# Patient Record
Sex: Female | Born: 1975
Health system: Southern US, Community
[De-identification: ages and names within clinical notes are randomized; demographics above are authoritative.]

## PROBLEM LIST (undated history)

## (undated) DIAGNOSIS — E119 Type 2 diabetes mellitus without complications: Secondary | ICD-10-CM

## (undated) DIAGNOSIS — E669 Obesity, unspecified: Secondary | ICD-10-CM

## (undated) DIAGNOSIS — N939 Abnormal uterine and vaginal bleeding, unspecified: Secondary | ICD-10-CM

## (undated) DIAGNOSIS — J45909 Unspecified asthma, uncomplicated: Secondary | ICD-10-CM

## (undated) DIAGNOSIS — K219 Gastro-esophageal reflux disease without esophagitis: Secondary | ICD-10-CM

## (undated) DIAGNOSIS — D219 Benign neoplasm of connective and other soft tissue, unspecified: Secondary | ICD-10-CM

## (undated) DIAGNOSIS — D649 Anemia, unspecified: Secondary | ICD-10-CM

## (undated) DIAGNOSIS — R569 Unspecified convulsions: Secondary | ICD-10-CM

## (undated) HISTORY — DX: Benign neoplasm of connective and other soft tissue, unspecified: D21.9

## (undated) HISTORY — PX: HYSTEROSCOPY WITH SALPINGOGRAM: SHX6653

## (undated) HISTORY — PX: BREAST REDUCTION SURGERY: SHX8

## (undated) HISTORY — PX: PANNICULECTOMY: SHX5360

## (undated) HISTORY — PX: KIDNEY STONE SURGERY: SHX686

## (undated) HISTORY — PX: LAPAROSCOPIC GASTRIC SLEEVE RESECTION: SHX5895

## (undated) HISTORY — DX: Type 2 diabetes mellitus without complications: E11.9

## (undated) HISTORY — PX: GASTRIC BYPASS: SHX52

## (undated) HISTORY — DX: Abnormal uterine and vaginal bleeding, unspecified: N93.9

## (undated) HISTORY — DX: Obesity, unspecified: E66.9

## (undated) HISTORY — DX: Anemia, unspecified: D64.9

## (undated) HISTORY — DX: Unspecified convulsions: R56.9

---

## 2013-05-02 DIAGNOSIS — G4730 Sleep apnea, unspecified: Secondary | ICD-10-CM | POA: Insufficient documentation

## 2019-07-03 ENCOUNTER — Encounter: Payer: Self-pay | Admitting: Family Medicine

## 2019-07-03 DIAGNOSIS — E119 Type 2 diabetes mellitus without complications: Secondary | ICD-10-CM | POA: Insufficient documentation

## 2019-07-05 ENCOUNTER — Encounter: Payer: Self-pay | Admitting: Family Medicine

## 2019-07-05 ENCOUNTER — Ambulatory Visit (INDEPENDENT_AMBULATORY_CARE_PROVIDER_SITE_OTHER): Payer: BC Managed Care – PPO | Admitting: Family Medicine

## 2019-07-05 ENCOUNTER — Other Ambulatory Visit: Payer: Self-pay

## 2019-07-05 VITALS — BP 138/82 | HR 73 | Ht 64.0 in | Wt 244.2 lb

## 2019-07-05 DIAGNOSIS — N939 Abnormal uterine and vaginal bleeding, unspecified: Secondary | ICD-10-CM

## 2019-07-05 DIAGNOSIS — D500 Iron deficiency anemia secondary to blood loss (chronic): Secondary | ICD-10-CM | POA: Diagnosis not present

## 2019-07-05 DIAGNOSIS — E119 Type 2 diabetes mellitus without complications: Secondary | ICD-10-CM | POA: Diagnosis not present

## 2019-07-05 LAB — POCT GLYCOSYLATED HEMOGLOBIN (HGB A1C): HbA1c, POC (controlled diabetic range): 7.5 % — AB (ref 0.0–7.0)

## 2019-07-05 MED ORDER — MEDROXYPROGESTERONE ACETATE 10 MG PO TABS: 10.0000 mg | ORAL_TABLET | Freq: Every day | ORAL | 0 refills | Status: DC

## 2019-07-05 NOTE — Progress Notes (Signed)
SUBJECTIVE:   CHIEF COMPLAINT / HPI:  Ms. Calcote is a 44 yo female who presents to establish care. She moved to [place] 2 years ago from [place] to be closer to family. She works 10 hour days for [employer]. She is single, no children. Is not sexually active and has never use tobacco products. She does not endorse alcohol use. Her concerns today are her diabetes and bleeding for 3 months.  Diabetes She is worried that her diabetes is out of control. She has not been taking any medication for it and has not seen a doctor in 2 years. She has had gastric sleeve surgery with partial antrum removal in 2015 where she lost weight but has since gained it back with poor eating habits. She is hungry all the time even after she eats. She recently at ravioli out of the can adding rice to it. She voices understanding that her diet needs to change but her job hinders her in a way of just needing quick meals. She does not check her blood sugars regular but has never needed to be on insulin in the past. She is not experiencing increasing numbness in her feet and hands and is worried about a black spot on her toe.   Abnormal Uterine Bleeding Experiencing on going uterine bleeding for 3 months. Was last seen for lower abdominal pain in 2019 where large fibroids were found and hysterectomy was recommended. Patient wanted the chance to have children and a silicone embolization was performed. Began to have regular period after that procedure but is now having on going bleeding that requires 3-12 pads/day. She does endorse fatigue and has passed clots described as big as an orange is round but flat. Color ranges from bright red to dark brown and is sometimes stringy.   PERTINENT  PMH / PSH: Fibroids (embolization procedure), sleeve gastrectomy, panis removal (does not have a navel), breast reduction, OSA.   OBJECTIVE:   BP 138/82   Pulse 73   Ht 5\' 4"  (1.626 m)   Wt 244 lb 3.2 oz (110.8 kg)    SpO2 100%   BMI 41.92 kg/m    General: Appears well, no acute distress. Age appropriate. Cardiac: RRR, normal heart sounds, no murmurs Respiratory: CTAB, normal effort Abdomen: soft, nontender, non distended, +BS Extremities: No edema or cyanosis. Skin: Warm and dry, no rashes notes  Diabetic Foot Exam - Simple   Simple Foot Form Diabetic Foot exam was performed with the following findings: Yes 07/05/2019  3:45 PM  Visual Inspection No deformities, no ulcerations, no other skin breakdown bilaterally: Yes Sensation Testing Intact to touch and monofilament testing bilaterally: Yes Pulse Check Posterior Tibialis and Dorsalis pulse intact bilaterally: Yes Comments      ASSESSMENT/PLAN:   Type II diabetes mellitus (HCC) Insulin naive. HgB A1c 7.5. Endorses polyphagia and parathesias. Patient previously taking metformin 500mg  BID. Spot on toe likely due from stubbing the toe and potentially not realizing it. Will continue to monitor for any changes. -Start Metformin BID -Consider gabapentin -Follow up in 1 week  Abnormal uterine bleeding Ongoing for a duration of 3 months with fatigue and known history of leiomyomas with previous suggestions of hysterectomy. Going through 3-12 pads a day. Hgb 8.2, MCV 60, platelets 463. Lymphocytes 3.2. CMP wnl. Anemia panel TIBC 404 Iron 11 Iron sat 3, Ferritin 6, retic ct 1.1. Etiology likely due to fibroids although other etiologies such as cancer or bleeding disorders cannot be ruled out. Less likely due  to hypothyroidism due to normal TSH of 1.660. Other endocrine cause such as diabetes can play a role in AUB but not very poorly controlled with an A1c of 7.5. -Provera 10mg  for 10 days -Referral to GYN -Follow up in 1 week  Iron deficiency anemia due to chronic blood loss Hgb 8.2, MCV 60, platelets 463. Lymphocytes 3.2. CMP wnl. Anemia panel TIBC 404 Iron 11 Iron sat 3, Ferritin 6, retic ct 1.1.  -Ferrous sulfate 300mg  daily -Gyn  referral -Follow up in 1 week   [place]

## 2019-07-05 NOTE — Patient Instructions (Addendum)
It was very nice to meet you today. Please enjoy the rest of your week. Will call you with your lab results.   You should get a call from the gynecologist office to make an appointment.   Take provera for 10 days. If you have any concerns please call us.  Please call the clinic at [phone] if your symptoms worsen or you have any concerns. It was our pleasure to serve you.

## 2019-07-06 ENCOUNTER — Telehealth: Payer: Self-pay | Admitting: Family Medicine

## 2019-07-06 DIAGNOSIS — N939 Abnormal uterine and vaginal bleeding, unspecified: Secondary | ICD-10-CM | POA: Insufficient documentation

## 2019-07-06 DIAGNOSIS — D500 Iron deficiency anemia secondary to blood loss (chronic): Secondary | ICD-10-CM | POA: Insufficient documentation

## 2019-07-06 MED ORDER — FERROUS SULFATE 300 (60 FE) MG/5ML PO SYRP: 300.0000 mg | ORAL_SOLUTION | Freq: Every day | ORAL | 3 refills | Status: DC

## 2019-07-06 MED ORDER — METFORMIN HCL ER 500 MG PO TB24: 500.0000 mg | ORAL_TABLET | Freq: Every day | ORAL | 3 refills | Status: DC

## 2019-07-06 NOTE — Assessment & Plan Note (Addendum)
Insulin naive. HgB A1c 7.5. Endorses polyphagia and parathesias. Patient previously taking metformin 500mg  BID. Spot on toe likely due from stubbing the toe and potentially not realizing it. Will continue to monitor for any changes. -Start Metformin BID -Consider gabapentin -Follow up in 1 week

## 2019-07-06 NOTE — Assessment & Plan Note (Signed)
Hgb 8.2, MCV 60, platelets 463. Lymphocytes 3.2. CMP wnl. Anemia panel TIBC 404 Iron 11 Iron sat 3, Ferritin 6, retic ct 1.1.  -Ferrous sulfate 300mg  daily -Gyn referral -Follow up in 1 week

## 2019-07-06 NOTE — Assessment & Plan Note (Addendum)
Ongoing for a duration of 3 months with fatigue and known history of leiomyomas with previous suggestions of hysterectomy. Going through 3-12 pads a day. Hgb 8.2, MCV 60, platelets 463. Lymphocytes 3.2. CMP wnl. Anemia panel TIBC 404 Iron 11 Iron sat 3, Ferritin 6, retic ct 1.1. Etiology likely due to fibroids although other etiologies such as cancer or bleeding disorders cannot be ruled out. Less likely due to hypothyroidism due to normal TSH of 1.660. Other endocrine cause such as diabetes can play a role in AUB but not very poorly controlled with an A1c of 7.5. -Provera 10mg  for 10 days -Referral to GYN -Follow up in 1 week

## 2019-07-06 NOTE — Telephone Encounter (Signed)
Attempted to call patient with results. Phone goes to voicemail. Will continue to try. Sent metformin and ferrous sulfate to pharmacy.

## 2019-07-07 LAB — CBC WITH DIFFERENTIAL/PLATELET
Basophils Absolute: 0 10*3/uL (ref 0.0–0.2)
Basos: 0 %
EOS (ABSOLUTE): 0.3 10*3/uL (ref 0.0–0.4)
Eos: 4 %
Hemoglobin: 8.2 g/dL — ABNORMAL LOW (ref 11.1–15.9)
Immature Grans (Abs): 0 10*3/uL (ref 0.0–0.1)
Immature Granulocytes: 0 %
Lymphocytes Absolute: 3.2 10*3/uL — ABNORMAL HIGH (ref 0.7–3.1)
Lymphs: 34 %
MCH: 16 pg — ABNORMAL LOW (ref 26.6–33.0)
MCHC: 26.5 g/dL — ABNORMAL LOW (ref 31.5–35.7)
MCV: 60 fL — ABNORMAL LOW (ref 79–97)
Monocytes Absolute: 0.7 10*3/uL (ref 0.1–0.9)
Monocytes: 7 %
Neutrophils Absolute: 5.1 10*3/uL (ref 1.4–7.0)
Neutrophils: 55 %
Platelets: 463 10*3/uL — ABNORMAL HIGH (ref 150–450)
RBC: 5.13 x10E6/uL (ref 3.77–5.28)
RDW: 21 % — ABNORMAL HIGH (ref 11.7–15.4)
WBC: 9.4 10*3/uL (ref 3.4–10.8)

## 2019-07-07 LAB — ANEMIA PANEL
Ferritin: 6 ng/mL — ABNORMAL LOW (ref 15–150)
Folate, Hemolysate: 278 ng/mL
Folate, RBC: 900 ng/mL (ref >498)
Hematocrit: 30.9 % — ABNORMAL LOW (ref 34.0–46.6)
Iron Saturation: 3 % — CL (ref 15–55)
Iron: 11 ug/dL — ABNORMAL LOW (ref 27–159)
Retic Ct Pct: 1.1 % (ref 0.6–2.6)
Total Iron Binding Capacity: 404 ug/dL (ref 250–450)
UIBC: 393 ug/dL (ref 131–425)
Vitamin B-12: 353 pg/mL (ref 232–1245)

## 2019-07-07 LAB — COMPREHENSIVE METABOLIC PANEL
ALT: 17 IU/L (ref 0–32)
AST: 20 IU/L (ref 0–40)
Albumin/Globulin Ratio: 0.9 — ABNORMAL LOW (ref 1.2–2.2)
Albumin: 3.8 g/dL (ref 3.8–4.8)
Alkaline Phosphatase: 107 IU/L (ref 39–117)
BUN/Creatinine Ratio: 16 (ref 9–23)
BUN: 10 mg/dL (ref 6–24)
Bilirubin Total: <0.2 mg/dL (ref 0.0–1.2)
CO2: 22 mmol/L (ref 20–29)
Calcium: 9.2 mg/dL (ref 8.7–10.2)
Chloride: 102 mmol/L (ref 96–106)
Creatinine, Ser: 0.61 mg/dL (ref 0.57–1.00)
GFR calc Af Amer: 128 mL/min/{1.73_m2} (ref >59)
GFR calc non Af Amer: 111 mL/min/{1.73_m2} (ref >59)
Globulin, Total: 4.1 g/dL (ref 1.5–4.5)
Glucose: 83 mg/dL (ref 65–99)
Potassium: 4.3 mmol/L (ref 3.5–5.2)
Sodium: 137 mmol/L (ref 134–144)
Total Protein: 7.9 g/dL (ref 6.0–8.5)

## 2019-07-07 LAB — TSH: TSH: 1.66 u[IU]/mL (ref 0.450–4.500)

## 2019-07-08 ENCOUNTER — Other Ambulatory Visit: Payer: Self-pay | Admitting: *Deleted

## 2019-07-08 DIAGNOSIS — D500 Iron deficiency anemia secondary to blood loss (chronic): Secondary | ICD-10-CM

## 2019-07-09 ENCOUNTER — Telehealth: Payer: Self-pay | Admitting: Family Medicine

## 2019-07-09 NOTE — Telephone Encounter (Signed)
Spoke with Ms. Mallery about her results and the need to start iron. Her insurance would not cover the iron and she will look for Iron over the counter and speak with the pharmacist for other options. She has taken the provena and feels as if her bleeding has gotten heavier. The gyn office has called to make an appointment but she missed the call due to being at work and calling back during their lunchtime. She will attempt again. Low blood level ED precautions given.   Rilynn Habel Autry-Lott, DO

## 2019-07-10 ENCOUNTER — Telehealth: Payer: Self-pay | Admitting: Family Medicine

## 2019-07-10 NOTE — Telephone Encounter (Signed)
Pt called and would like to have another referral placed for gynecology. She was originally not able to schedule with the office the first referral was sent to due to her work schedule but she will now be able to make it work.   Please call pt with any questions.

## 2019-07-11 ENCOUNTER — Other Ambulatory Visit: Payer: Self-pay | Admitting: Family Medicine

## 2019-07-11 DIAGNOSIS — D500 Iron deficiency anemia secondary to blood loss (chronic): Secondary | ICD-10-CM

## 2019-07-12 ENCOUNTER — Other Ambulatory Visit: Payer: Self-pay

## 2019-07-12 ENCOUNTER — Ambulatory Visit (INDEPENDENT_AMBULATORY_CARE_PROVIDER_SITE_OTHER): Payer: BC Managed Care – PPO | Admitting: Family Medicine

## 2019-07-12 ENCOUNTER — Encounter: Payer: Self-pay | Admitting: Family Medicine

## 2019-07-12 VITALS — BP 130/72 | HR 77 | Wt 245.2 lb

## 2019-07-12 DIAGNOSIS — N939 Abnormal uterine and vaginal bleeding, unspecified: Secondary | ICD-10-CM

## 2019-07-12 DIAGNOSIS — R202 Paresthesia of skin: Secondary | ICD-10-CM

## 2019-07-12 DIAGNOSIS — D500 Iron deficiency anemia secondary to blood loss (chronic): Secondary | ICD-10-CM | POA: Diagnosis not present

## 2019-07-12 MED ORDER — GABAPENTIN 100 MG PO CAPS: 100.0000 mg | ORAL_CAPSULE | Freq: Three times a day (TID) | ORAL | 3 refills | Status: DC

## 2019-07-12 NOTE — Progress Notes (Signed)
    SUBJECTIVE:   CHIEF COMPLAINT / HPI:  [name] is a 44 yo female presenting for a follow up visit.  Anemia 2/2 AUB Since our las visit she has taken provera which has not stopped the bleeding but has made it lighter. She continues to require for pads a day. She has experienced increased heart rate, shortness of breath, and tingling in the hands and feet. She is taking iron 325mg  daily. Her gyn appointment is set up for 08/01/2019. She does not want a hysterectomy but understands that this may have to happened to control the bleeding.  Paresthesias Continues to experience tingling in the hands and feet which was a concern at our last visit.   PERTINENT  PMH / PSH: T2DM, Iron deficiency anemia  OBJECTIVE:   BP 130/72   Pulse 77   Wt 245 lb 3.2 oz (111.2 kg)   SpO2 99%   BMI 42.09 kg/m    General: Appears well, no acute distress. Age appropriate. Cardiac: RRR, normal heart sounds, no murmurs Respiratory: CTAB, normal effort  ASSESSMENT/PLAN:   Abnormal uterine bleeding -Continue provera -Continue iron 325mg  daily -ED precautions given -GYN appt 4/1 -U/S order placed -Follow up as needed or if symptoms worsen  Paresthesias More likely due to her degree of iron deficiency anemia than her diabetes (A1c 7.5) and vitamin b12 wnl. -Continue iron 325mg  daily -Start gabapentin 100mg  TID   Gerlene Fee, DO [place]

## 2019-07-12 NOTE — Patient Instructions (Addendum)
You will be called to schedule an ultrasound  Continue the provera  Continue 325mg  iron  Start gabapentin 100mg    Your gynecology appt is 08/01/2019 @3 :30pm  Please call the clinic at [phone] if your symptoms worsen or you have any concerns. It was our pleasure to serve you.

## 2019-07-14 DIAGNOSIS — R202 Paresthesia of skin: Secondary | ICD-10-CM | POA: Insufficient documentation

## 2019-07-14 NOTE — Assessment & Plan Note (Signed)
More likely due to her degree of iron deficiency anemia than her diabetes (A1c 7.5) and vitamin b12 wnl. -Continue iron 325mg  daily -Start gabapentin 100mg  TID

## 2019-07-14 NOTE — Assessment & Plan Note (Signed)
-  Continue provera -Continue iron 325mg  daily -ED precautions given -GYN appt 4/1 -U/S order placed -Follow up as needed or if symptoms worsen

## 2019-07-16 ENCOUNTER — Telehealth: Payer: Self-pay

## 2019-07-16 ENCOUNTER — Telehealth: Payer: Self-pay | Admitting: Family Medicine

## 2019-07-16 NOTE — Telephone Encounter (Signed)
Pt has an appt for Friday the 19 th at 3:30 pm. Pt will call back if she can't make the appt. I explained 3:30 is the last appt for this [place] as it is a 1 hour long exam. Pt will need to drink 32 oz of water, go alone and wear a mask. Pt has been informed. Ottis Stain, CMA

## 2019-07-16 NOTE — Telephone Encounter (Signed)
Pt is calling to inform Dr. Janus Molder know that she has not received a call about scheduling her ultrasound. I told her there is a referral going to be placed and that they will call to schedule.   Pt said that she does have to have this scheduled before her gyn appointment on 08/04/19.

## 2019-07-16 NOTE — Telephone Encounter (Signed)
Patient calls back. Patient stated she does not want to take the Gabapentin due to the risk of dependence. Patient stated she did some research and Gabapentin is not for her. Advised patient without it she should still see improvements with iron treatments. Patient advised to call back if sxs worsen or do not improve with iron.

## 2019-07-16 NOTE — Telephone Encounter (Signed)
Did you schedule this ultrasound? Cassandra Kim Kennon Holter, CMA

## 2019-07-16 NOTE — Telephone Encounter (Signed)
Ideally I would like for Cassandra Kim to try the gabapentin but if she does not want to take it, not taking it would not be detrimental. Her symptoms are likely due to her anemia and not her diabetes and should resolved with iron treatment. We can discuss other options at subsequent visits if symptoms persist.   Gerlene Fee, DO

## 2019-07-16 NOTE — Telephone Encounter (Signed)
Patient calls nurse line stating she does not want to take Gabapentin. Patient didn't leave any reasons to why, just that she doesn't want to take it. Patient is requesting another medication to take place. I attempted to call her twice to get more details, however no answer and no option to leave a VM. Will forward to PCP in the meantime.

## 2019-07-17 ENCOUNTER — Other Ambulatory Visit: Payer: BC Managed Care – PPO

## 2019-07-19 ENCOUNTER — Ambulatory Visit
Admission: RE | Admit: 2019-07-19 | Discharge: 2019-07-19 | Disposition: A | Discharge status: Home / Self Care | Payer: BC Managed Care – PPO | Source: Ambulatory Visit | Attending: Family Medicine | Admitting: Family Medicine

## 2019-07-19 DIAGNOSIS — D500 Iron deficiency anemia secondary to blood loss (chronic): Secondary | ICD-10-CM

## 2019-07-22 ENCOUNTER — Telehealth: Payer: Self-pay

## 2019-07-22 NOTE — Telephone Encounter (Signed)
Patient calling requesting results from ultrasound results from 07/19/19. Patient states that she is calling from an app and that she cannot receive phone calls. Patient states that she will return phone call to office tomorrow afternoon.   To PCP  Talbot Grumbling, RN

## 2019-07-24 NOTE — Telephone Encounter (Signed)
Patient aware of results and will follow up with GYN 4/1. Concerns and all questions answered during this call.   Dr. Janus Molder, DO

## 2019-07-24 NOTE — Telephone Encounter (Signed)
Attempted to call patient at [phone]. Did not ring, went to voicemail and not able to leave a message.  Dr. Janus Molder, DO

## 2019-07-25 NOTE — Telephone Encounter (Signed)
Attempted to call patient x 2 to remind of appointment.  No answer and no voicemail available.  Patient will be given a reminder and covid screening call the day before appointment.  Ozella Almond, [place]

## 2019-07-28 ENCOUNTER — Other Ambulatory Visit: Payer: Self-pay | Admitting: Family Medicine

## 2019-07-28 DIAGNOSIS — N939 Abnormal uterine and vaginal bleeding, unspecified: Secondary | ICD-10-CM

## 2019-07-31 ENCOUNTER — Encounter: Payer: Self-pay | Admitting: Obstetrics and Gynecology

## 2019-08-01 ENCOUNTER — Other Ambulatory Visit: Payer: Self-pay | Admitting: Family Medicine

## 2019-08-01 ENCOUNTER — Ambulatory Visit: Payer: BC Managed Care – PPO | Admitting: Obstetrics and Gynecology

## 2019-08-01 DIAGNOSIS — N939 Abnormal uterine and vaginal bleeding, unspecified: Secondary | ICD-10-CM

## 2019-08-22 ENCOUNTER — Other Ambulatory Visit (HOSPITAL_COMMUNITY)
Admission: RE | Admit: 2019-08-22 | Discharge: 2019-08-22 | Disposition: A | Discharge status: Home / Self Care | Payer: BC Managed Care – PPO | Source: Ambulatory Visit | Attending: Obstetrics and Gynecology | Admitting: Obstetrics and Gynecology

## 2019-08-22 ENCOUNTER — Encounter: Payer: Self-pay | Admitting: Obstetrics and Gynecology

## 2019-08-22 ENCOUNTER — Ambulatory Visit: Payer: BC Managed Care – PPO | Admitting: Obstetrics and Gynecology

## 2019-08-22 ENCOUNTER — Other Ambulatory Visit: Payer: Self-pay

## 2019-08-22 VITALS — BP 138/80 | HR 84 | Temp 97.3°F | Resp 12 | Ht 64.0 in | Wt 244.0 lb

## 2019-08-22 DIAGNOSIS — R102 Pelvic and perineal pain unspecified side: Secondary | ICD-10-CM

## 2019-08-22 DIAGNOSIS — Z113 Encounter for screening for infections with a predominantly sexual mode of transmission: Secondary | ICD-10-CM

## 2019-08-22 DIAGNOSIS — D259 Leiomyoma of uterus, unspecified: Secondary | ICD-10-CM | POA: Diagnosis not present

## 2019-08-22 DIAGNOSIS — N921 Excessive and frequent menstruation with irregular cycle: Secondary | ICD-10-CM | POA: Insufficient documentation

## 2019-08-22 DIAGNOSIS — N739 Female pelvic inflammatory disease, unspecified: Secondary | ICD-10-CM | POA: Diagnosis not present

## 2019-08-22 DIAGNOSIS — D500 Iron deficiency anemia secondary to blood loss (chronic): Secondary | ICD-10-CM

## 2019-08-22 DIAGNOSIS — Z124 Encounter for screening for malignant neoplasm of cervix: Secondary | ICD-10-CM

## 2019-08-22 MED ORDER — METRONIDAZOLE 500 MG PO TABS: 500.0000 mg | ORAL_TABLET | Freq: Two times a day (BID) | ORAL | 0 refills | Status: DC

## 2019-08-22 MED ORDER — DOXYCYCLINE HYCLATE 100 MG PO CAPS: 100.0000 mg | ORAL_CAPSULE | Freq: Two times a day (BID) | ORAL | 0 refills | Status: DC

## 2019-08-22 MED ORDER — NORETHINDRONE ACETATE 5 MG PO TABS: 5.0000 mg | ORAL_TABLET | Freq: Every day | ORAL | 1 refills | Status: DC

## 2019-08-22 MED ORDER — CEFTRIAXONE SODIUM 500 MG IJ SOLR
500.0000 mg | Freq: Once | INTRAMUSCULAR | Status: AC
  Administered 2019-08-22: 16:00:00 500 mg via INTRAMUSCULAR

## 2019-08-22 NOTE — Progress Notes (Signed)
44 y.o. G0P0000 Single Black or African American Not Hispanic or Latino female here as a new patient consult from Dr Janus Molder for AUB. Per patient has been bleeding for 6 months consistently. Patient states that she has clots and throbbing pain in her lower abdomen. The patient states her primary recommended she have a blood transfusion, however due to COVID, patient declined.     07/05/19 labs with hgb of 8.2, ferritin 6, iron 11, HgbA1C is 7.5 She c/o fatigue for the last 3 months, cold all the time. She feels pressure in her lower abdomen all the time, throbbing. She has intermittent neuropathy in her hands and feet.   She is taking one iron tablet a day.   Prior to 6 months ago her cycles were monthly x 3 days. Saturating a pad in 2 hours at most.  In September she had 2 cycles, by November she was bleeding daily. Bleeding goes from heavy to light. Can have a gush of blood clots. She started on Provera in early March, initially lightened her bleeding, was on it for a month, last 5 days it got heavy again. Bleeding has been going from light to heavy in the last 4 weeks.  In ~2018 she thinks she had a uterine artery embolization? The bleeding got lighter for a while.    Not sexually active for 4 years.   Period Cycle (Days): (irregular) Period Duration (Days): (patient has had a consistent period for 6 months) Period Pattern: (!) Irregular Menstrual Flow: Heavy(with clots) Menstrual Control: Maxi pad Menstrual Control Change Freq (Hours): 2 Dysmenorrhea: (!) Moderate Dysmenorrhea Symptoms: Cramping, Headache, Throbbing   Ultrasound from 07/19/19: fibroid uterus. Overall uterine measurement 16.1 x 8.7 x 9.9 cm. 7.6 x 6.9 x 8 cm mass in the upper uterus c/w fibroid. Extends transmural and obscuring the endometrium.   Patient's last menstrual period was 02/04/2019 (within weeks).          Sexually active: No.  The current method of family planning is abstinence.    Exercising: No.  The  patient does not participate in regular exercise at present. Smoker:  no  Health Maintenance: Pap:  6 years ago per patient in [place], [place] History of abnormal Pap:  no MMG:  never BMD:   n/a Colonoscopy: n/a TDaP:  UTD per patient Gardasil: No   reports that she has never smoked. She has never used smokeless tobacco. She reports previous alcohol use. She reports that she does not use drugs.  Past Medical History:  Diagnosis Date  . Abnormal uterine bleeding   . Anemia   . Diabetes mellitus without complication ([place])   . Fibroid   . Obesity     Past Surgical History:  Procedure Laterality Date  . BREAST REDUCTION SURGERY Bilateral   . GASTRIC BYPASS    . HYSTEROSCOPY WITH SALPINGOGRAM    . KIDNEY STONE SURGERY    . LAPAROSCOPIC GASTRIC SLEEVE RESECTION    . PANNICULECTOMY      Current Outpatient Medications  Medication Sig Dispense Refill  . ferrous sulfate 325 (65 FE) MG tablet Take 325 mg by mouth daily with breakfast.    . metFORMIN (GLUCOPHAGE-XR) 500 MG 24 hr tablet Take 1 tablet (500 mg total) by mouth daily with breakfast. 180 tablet 3   No current facility-administered medications for this visit.    Family History  Problem Relation Age of Onset  . Asthma Mother   . Diabetes Mother   . Cancer Sister  Review of Systems  Constitutional: Negative.   HENT: Negative.   Eyes: Negative.   Respiratory: Negative.   Cardiovascular: Negative.   Gastrointestinal: Negative.   Endocrine: Negative.   Genitourinary: Positive for pelvic pain and vaginal bleeding.  Musculoskeletal: Negative.   Skin: Negative.   Allergic/Immunologic: Negative.   Neurological: Negative.   Hematological: Negative.   Psychiatric/Behavioral: Negative.     Exam:   BP 138/80 (BP Location: Right Arm, Patient Position: Sitting, Cuff Size: Normal)   Pulse 84   Temp (!) 97.3 F (36.3 C) (Temporal)   Resp 12   Ht 5\' 4"  (1.626 m)   Wt 244 lb (110.7 kg)   LMP 02/04/2019 (Within  Weeks) Comment: bleeding x6 months  BMI 41.88 kg/m   Weight change: @WEIGHTCHANGE @ Height:   Height: 5\' 4"  (162.6 cm)  Ht Readings from Last 3 Encounters:  08/22/19 5\' 4"  (1.626 m)  07/05/19 5\' 4"  (1.626 m)    General appearance: alert, cooperative and appears stated age Head: Normocephalic, without obvious abnormality, atraumatic Neck: no adenopathy, supple, symmetrical, trachea midline and thyroid normal to inspection and palpation Lungs: clear to auscultation bilaterally Cardiovascular: regular rate and rhythm Breasts: normal appearance, no masses or tenderness Abdomen: soft, tender firm mass felt in her lower abdomen, ~16 week sized uterus. No rebound or guarding. She doesn't have an umbilicus.  Extremities: extremities normal, atraumatic, no cyanosis or edema Skin: Skin color, texture, turgor normal. No rashes or lesions Lymph nodes: Cervical, supraclavicular, and axillary nodes normal. No abnormal inguinal nodes palpated Neurologic: Grossly normal   Pelvic: External genitalia:  no lesions              Urethra:  normal appearing urethra with no masses, tenderness or lesions              Bartholins and Skenes: normal                 Vagina: normal appearing vagina with an increase in vaginal discharge, brown tinged, looked like pus.              Cervix: no lesions and +/- cervical motion tenderness               Bimanual Exam:  Uterus:  enlarged, ~16 week sized, mobile, very tender, up out of her pelvis.               Adnexa: diffusely tender on BM exam   The risks of endometrial curettage were reviewed and a consent was obtained.  A speculum was placed in the vagina and the cervix was cleansed with betadine. A tenaculum was placed on the cervix and the uterine evacuator was placed into the endometrial cavity. The uterus sounded to ~15 cm. The endometrial curettage was performed, taking care to get a representative sample, sampling 360 degrees of the uterine cavity. Moderate  tissue/blood was obtained. The tenaculum and speculum were removed. There were no complications.                  Terence Lux chaperoned for the exam.  A:  Menometrorrhagia  Anemia  Fibroid uterus  Pelvic pain  Tender on pelvic and abdominal exam  Exam concerning for infection.   P:   Hgb 9  Will send CBC with diff and ferritin  Pap with hpv  Endometrial curetting's to pathology  Will send a culture from the endometrial curetting's given the patients tenderness  Will treat for PID, ceftriaxone 500 mg x 1 now. Will  send home on doxycycline and flagyl x 2 weeks  Aygestin 5 mg a day  F/U next week  Will further discuss a plan at her next visit after labs are back and she has been on antibiotics for over 48 hours.   Increase iron to BID, discussed possible referral to hematology for an iron transfusion   CC: Dr Janus Molder Note sent

## 2019-08-22 NOTE — Progress Notes (Signed)
Ceftriaxone 500 mg given IM in [facility]. NKDA. Patient tolerated well. Remained in office for 30 minutes following injection without any complications.

## 2019-08-22 NOTE — Patient Instructions (Addendum)
Endometrial Biopsy Post-procedure Instructions . Cramping is common.  You may take Ibuprofen, Aleve, or Tylenol for the cramping.  This should resolve within 24 hours.   . You may have a small amount of spotting.  You should wear a mini pad for the next few days. . You may have intercourse in 24 hours. . You need to call the office if you have any pelvic pain, fever, heavy bleeding, or foul smelling vaginal discharge. . Shower or bathe as normal . You will be notified within one week of your biopsy results or we will discuss your results at your follow-up appointment if needed.   Abnormal Uterine Bleeding Abnormal uterine bleeding is unusual bleeding from the uterus. It includes:  Bleeding or spotting between periods.  Bleeding after sex.  Bleeding that is heavier than normal.  Periods that last longer than usual.  Bleeding after menopause. Abnormal uterine bleeding can affect women at various stages in life, including teenagers, women in their reproductive years, pregnant women, and women who have reached menopause. Common causes of abnormal uterine bleeding include:  Pregnancy.  Growths of tissue (polyps).  A noncancerous tumor in the uterus (fibroid).  Infection.  Cancer.  Hormonal imbalances. Any type of abnormal bleeding should be evaluated by a health care provider. Many cases are minor and simple to treat, while others are more serious. Treatment will depend on the cause of the bleeding. Follow these instructions at home:  Monitor your condition for any changes.  Do not use tampons, douche, or have sex if told by your health care provider.  Change your pads often.  Get regular exams that include pelvic exams and cervical cancer screening.  Keep all follow-up visits as told by your health care provider. This is important. Contact a health care provider if:  Your bleeding lasts for more than one week.  You feel dizzy at times.  You feel nauseous or you  vomit. Get help right away if:  You pass out.  Your bleeding soaks through a pad every hour.  You have abdominal pain.  You have a fever.  You become sweaty or weak.  You pass large blood clots from your vagina. Summary  Abnormal uterine bleeding is unusual bleeding from the uterus.  Any type of abnormal bleeding should be evaluated by a health care provider. Many cases are minor and simple to treat, while others are more serious.  Treatment will depend on the cause of the bleeding. This information is not intended to replace advice given to you by your health care provider. Make sure you discuss any questions you have with your health care provider. Document Revised: 07/26/2017 Document Reviewed: 05/20/2016 Elsevier Patient Education  [place]. Uterine Fibroids  Uterine fibroids (leiomyomas) are noncancerous (benign) tumors that can develop in the uterus. Fibroids may also develop in the fallopian tubes, cervix, or tissues (ligaments) near the uterus. You may have one or many fibroids. Fibroids vary in size, weight, and where they grow in the uterus. Some can become quite large. Most fibroids do not require medical treatment. What are the causes? The cause of this condition is not known. What increases the risk? You are more likely to develop this condition if you:  Are in your 30s or 40s and have not gone through menopause.  Have a family history of this condition.  Are of African-American descent.  Had your first period at an early age (early menarche).  Have not had any children (nulliparity).  Are overweight or  obese. What are the signs or symptoms? Many women do not have any symptoms. Symptoms of this condition may include:  Heavy menstrual bleeding.  Bleeding or spotting between periods.  Pain and pressure in the pelvic area, between the hips.  Bladder problems, such as needing to urinate urgently or more often than usual.  Inability to have  children (infertility).  Failure to carry pregnancy to term (miscarriage). How is this diagnosed? This condition may be diagnosed based on:  Your symptoms and medical history.  A physical exam.  A pelvic exam that includes feeling for any tumors.  Imaging tests, such as ultrasound or MRI. How is this treated? Treatment for this condition may include:  Seeing your health care provider for follow-up visits to monitor your fibroids for any changes.  Taking NSAIDs such as ibuprofen, naproxen, or aspirin to reduce pain.  Hormone medicines. These may be taken as a pill, given in an injection, or delivered by a T-shaped device that is inserted into the uterus (intrauterine device, IUD).  Surgery to remove one of the following: ? The fibroids (myomectomy). Your health care provider may recommend this if fibroids affect your fertility and you want to become pregnant. ? The uterus (hysterectomy). ? Blood supply to the fibroids (uterine artery embolization). Follow these instructions at home:  Take over-the-counter and prescription medicines only as told by your health care provider.  Ask your health care provider if you should take iron pills or eat more iron-rich foods, such as dark green, leafy vegetables. Heavy menstrual bleeding can cause low iron levels.  If directed, apply heat to your back or abdomen to reduce pain. Use the heat source that your health care provider recommends, such as a moist heat pack or a heating pad. ? Place a towel between your skin and the heat source. ? Leave the heat on for 20-30 minutes. ? Remove the heat if your skin turns bright red. This is especially important if you are unable to feel pain, heat, or cold. You may have a greater risk of getting burned.  Pay close attention to your menstrual cycle. Tell your health care provider about any changes, such as: ? Increased blood flow that requires you to use more pads or tampons than usual. ? A change in  the number of days that your period lasts. ? A change in symptoms that are associated with your period, such as back pain or cramps in your abdomen.  Keep all follow-up visits as told by your health care provider. This is important, especially if your fibroids need to be monitored for any changes. Contact a health care provider if you:  Have pelvic pain, back pain, or cramps in your abdomen that do not get better with medicine or heat.  Develop new bleeding between periods.  Have increased bleeding during or between periods.  Feel unusually tired or weak.  Feel light-headed. Get help right away if you:  Faint.  Have pelvic pain that suddenly gets worse.  Have severe vaginal bleeding that soaks a tampon or pad in 30 minutes or less. Summary  Uterine fibroids are noncancerous (benign) tumors that can develop in the uterus.  The exact cause of this condition is not known.  Most fibroids do not require medical treatment unless they affect your ability to have children (fertility).  Contact a health care provider if you have pelvic pain, back pain, or cramps in your abdomen that do not get better with medicines.  Make sure you know what  symptoms should cause you to get help right away. This information is not intended to replace advice given to you by your health care provider. Make sure you discuss any questions you have with your health care provider. Document Revised: 03/31/2017 Document Reviewed: 03/14/2017 Elsevier Patient Education  2020 [employer].

## 2019-08-23 LAB — CBC WITH DIFFERENTIAL/PLATELET
Basophils Absolute: 0.1 10*3/uL (ref 0.0–0.2)
Basos: 1 %
EOS (ABSOLUTE): 0.5 10*3/uL — ABNORMAL HIGH (ref 0.0–0.4)
Eos: 4 %
Hematocrit: 30.7 % — ABNORMAL LOW (ref 34.0–46.6)
Hemoglobin: 8.7 g/dL — ABNORMAL LOW (ref 11.1–15.9)
Immature Grans (Abs): 0 10*3/uL (ref 0.0–0.1)
Immature Granulocytes: 0 %
Lymphocytes Absolute: 2.8 10*3/uL (ref 0.7–3.1)
Lymphs: 27 %
MCH: 18.4 pg — ABNORMAL LOW (ref 26.6–33.0)
MCHC: 28.3 g/dL — ABNORMAL LOW (ref 31.5–35.7)
MCV: 65 fL — ABNORMAL LOW (ref 79–97)
Monocytes Absolute: 0.8 10*3/uL (ref 0.1–0.9)
Monocytes: 8 %
Neutrophils Absolute: 6.2 10*3/uL (ref 1.4–7.0)
Neutrophils: 60 %
Platelets: 471 10*3/uL — ABNORMAL HIGH (ref 150–450)
RBC: 4.72 x10E6/uL (ref 3.77–5.28)
RDW: 25 % — ABNORMAL HIGH (ref 11.7–15.4)
WBC: 10.3 10*3/uL (ref 3.4–10.8)

## 2019-08-23 LAB — FERRITIN: Ferritin: 19 ng/mL (ref 15–150)

## 2019-08-25 LAB — WOUND CULTURE: Organism ID, Bacteria: NONE SEEN

## 2019-08-26 LAB — SURGICAL PATHOLOGY

## 2019-08-27 ENCOUNTER — Telehealth: Payer: Self-pay | Admitting: *Deleted

## 2019-08-27 DIAGNOSIS — D500 Iron deficiency anemia secondary to blood loss (chronic): Secondary | ICD-10-CM

## 2019-08-27 LAB — CYTOLOGY - PAP
Adequacy: ABNORMAL
Chlamydia: NEGATIVE
Comment: NEGATIVE
Comment: NEGATIVE
Comment: NEGATIVE
Comment: NORMAL
High risk HPV: NEGATIVE
Neisseria Gonorrhea: NEGATIVE
Trichomonas: NEGATIVE

## 2019-08-27 NOTE — Telephone Encounter (Signed)
Burnice Logan, RN  08/27/2019 8:55 AM EDT    Left message on mobile number to call Sharee Pimple, RN at [place].

## 2019-08-27 NOTE — Telephone Encounter (Signed)
-----   Message from Salvadore Dom, MD sent at 08/23/2019 10:31 AM EDT ----- Please let the patient know that her hgb is 8.7 and her ferritin level is 19 (up from 6, but still low). Yesterday we discussed her increasing her iron to BID. I think she needs a referral to hematology for an iron transfusion. Can you please discuss this with her and set up the consultation.

## 2019-08-28 NOTE — Telephone Encounter (Signed)
Spoke with patient. Advised of results as seen below per Dr. Talbert Nan. Patient is agreeable to proceed with referral to hematology. Reviewed provider options, patient request to schedule in [place], if possible, also ok with [place]. Order placed. Patient did increase iron to BID. Will see Dr. Talbert Nan for f/u on 08/29/19. Patient verbalizes understanding and is agreeable.   Routing to provider for final review. Patient is agreeable to disposition. Will close encounter.  Cc: Magdalene Patricia

## 2019-08-29 ENCOUNTER — Telehealth: Payer: Self-pay | Admitting: *Deleted

## 2019-08-29 ENCOUNTER — Ambulatory Visit (INDEPENDENT_AMBULATORY_CARE_PROVIDER_SITE_OTHER): Payer: BC Managed Care – PPO | Admitting: Obstetrics and Gynecology

## 2019-08-29 ENCOUNTER — Observation Stay: Admit: 2019-08-29 | Payer: BC Managed Care – PPO | Admitting: Obstetrics and Gynecology

## 2019-08-29 ENCOUNTER — Other Ambulatory Visit: Payer: Self-pay

## 2019-08-29 ENCOUNTER — Encounter: Payer: Self-pay | Admitting: Obstetrics and Gynecology

## 2019-08-29 ENCOUNTER — Other Ambulatory Visit: Payer: Self-pay | Admitting: Obstetrics & Gynecology

## 2019-08-29 ENCOUNTER — Observation Stay
Admission: AD | Admit: 2019-08-29 | Payer: BC Managed Care – PPO | Source: Ambulatory Visit | Admitting: Obstetrics and Gynecology

## 2019-08-29 VITALS — BP 130/68 | HR 81 | Temp 97.5°F | Ht 64.0 in | Wt 248.0 lb

## 2019-08-29 DIAGNOSIS — N739 Female pelvic inflammatory disease, unspecified: Secondary | ICD-10-CM | POA: Diagnosis not present

## 2019-08-29 DIAGNOSIS — D219 Benign neoplasm of connective and other soft tissue, unspecified: Secondary | ICD-10-CM

## 2019-08-29 DIAGNOSIS — N898 Other specified noninflammatory disorders of vagina: Secondary | ICD-10-CM

## 2019-08-29 MED ORDER — CEPHALEXIN 500 MG PO CAPS: 500.0000 mg | ORAL_CAPSULE | Freq: Four times a day (QID) | ORAL | 0 refills | Status: DC

## 2019-08-29 NOTE — H&P (Signed)
Cc: pelvic pain/bleeding  HPI: 44 y.o.   Single Black or African American Not Hispanic or Latino  female   [id number] with No LMP recorded. (Menstrual status: Irregular Periods).   here for follow up AUB and abdominal/pelvic pain. She has an enlarged fibroid uterus with prior h/o uterine artery embolization.  She was seen one week ago c/o 6 months of constant bleeding and throbbing pain in her lower abdomen. Her exam was concerning for a pelvic infection. endometrial biopsy returned with acute and chronic endometritis, no hyperplasia or malignancy. Endometrial culture returned with GBS.  Pap was unsatisfactory secondary to inflammation. GC/CT negative. She hasn't been sexually active x 4 years.    She was started on antibiotics last week for presumed PID. Her pelvic discomfort has gone from an 8/10 to a 4/10 in severity. She is having a large amount of vaginal discharge. Vaginal odor has improved, but the D/C is more. Her bleeding has been getting lighter, just mild spotting, occasional blood clots in the toilet. She is on Aygestin.  Hgb from 08/22/19 returned at 8.7 with a ferritin of 19  Ultrasound from 07/19/19: fibroid uterus. Overall uterine measurement 16.1 x 8.7 x 9.9 cm. 7.6 x 6.9 x 8 cm mass in the upper uterus c/w fibroid. Extends transmural and obscuring the endometrium.  GYNECOLOGIC HISTORY: No LMP recorded. (Menstrual status: Irregular Periods). Contraception: abstinence  Menopausal hormone therapy: none                 OB History    Gravida  0   Para  0   Term  0   Preterm  0   AB  0   Living  0     SAB  0   TAB  0   Ectopic  0   Multiple  0   Live Births  0                  Patient Active Problem List   Diagnosis Date Noted  . Paresthesias 07/14/2019  . Abnormal uterine bleeding 07/06/2019  . Iron deficiency anemia due to chronic blood loss 07/06/2019  . Type II diabetes mellitus ([place]) 07/03/2019  . Sleep apnea 05/02/2013  Doesn't have  CPAP      Past Medical History:  Diagnosis Date  . Abnormal uterine bleeding   . Anemia   . Diabetes mellitus without complication ([place])   . Fibroid   . Obesity     Past Surgical History:  Procedure Laterality Date  . BREAST REDUCTION SURGERY Bilateral   . GASTRIC BYPASS    . HYSTEROSCOPY WITH SALPINGOGRAM    . KIDNEY STONE SURGERY    . LAPAROSCOPIC GASTRIC SLEEVE RESECTION    . PANNICULECTOMY            Current Outpatient Medications  Medication Sig Dispense Refill  . doxycycline (VIBRAMYCIN) 100 MG capsule Take 1 capsule (100 mg total) by mouth 2 (two) times daily. Take BID for 14 days.  Take with food as can cause GI distress. 28 capsule 0  . ferrous sulfate 325 (65 FE) MG tablet Take 325 mg by mouth daily with breakfast.    . metFORMIN (GLUCOPHAGE-XR) 500 MG 24 hr tablet Take 1 tablet (500 mg total) by mouth daily with breakfast. 180 tablet 3  . metroNIDAZOLE (FLAGYL) 500 MG tablet Take 1 tablet (500 mg total) by mouth 2 (two) times daily. 28 tablet 0  . norethindrone (AYGESTIN) 5 MG tablet Take 1 tablet (5 mg total)  by mouth daily. 30 tablet 1   No current facility-administered medications for this visit.     ALLERGIES: Patient has no known allergies.       Family History  Problem Relation Age of Onset  . Asthma Mother   . Diabetes Mother   . Cancer Sister     Social History        Socioeconomic History  . Marital status: Single    Spouse name: Not on file  . Number of children: Not on file  . Years of education: Not on file  . Highest education level: Not on file  Occupational History  . Not on file  Tobacco Use  . Smoking status: Never Smoker  . Smokeless tobacco: Never Used  Substance and Sexual Activity  . Alcohol use: Not Currently  . Drug use: Never  . Sexual activity: Not Currently    Partners: Female    Birth control/protection: Abstinence    Comment: last SA x9 years ago per patient  Other Topics  Concern  . Not on file  Social History Narrative  . Not on file   Social Determinants of Health      Financial Resource Strain:   . Difficulty of Paying Living Expenses:   Food Insecurity:   . Worried About Charity fundraiser in the Last Year:   . Arboriculturist in the Last Year:   Transportation Needs:   . Film/video editor (Medical):   Marland Kitchen Lack of Transportation (Non-Medical):   Physical Activity:   . Days of Exercise per Week:   . Minutes of Exercise per Session:   Stress:   . Feeling of Stress :   Social Connections:   . Frequency of Communication with Friends and Family:   . Frequency of Social Gatherings with Friends and Family:   . Attends Religious Services:   . Active Member of Clubs or Organizations:   . Attends Archivist Meetings:   Marland Kitchen Marital Status:   Intimate Partner Violence:   . Fear of Current or Ex-Partner:   . Emotionally Abused:   Marland Kitchen Physically Abused:   . Sexually Abused:     Review of Systems  All other systems reviewed and are negative.   PHYSICAL EXAMINATION:    There were no vitals taken for this visit.    General appearance: alert, cooperative and appears stated age Neck: no adenopathy, supple, symmetrical, trachea midline and thyroid normal to inspection and palpation Heart: regular rate and rhythm Lungs: CTAB Abdomen: soft, uterus is 16 week sized and extremely tender Extremities: normal, atraumatic, no cyanosis Skin: normal color, texture and turgor, no rashes or lesions Lymph: normal cervical supraclavicular and inguinal nodes Neurologic: grossly normal   Pelvic: External genitalia:  no lesions              Urethra:  normal appearing urethra with no masses, tenderness or lesions              Bartholins and Skenes: normal                 Vagina: normal appearing vagina with copious amounts of watery vaginal discharge              Cervix: +/- CMT              Bimanual Exam:  Uterus:  16 week sized, extremely  tender, mobile.               Adnexa: no  masses bilateral tenderness               Chaperone was present for exam.  ASSESSMENT Pelvic infection in patient with an enlarged fibroid uterus. Acute and chronic endometritis on endometrial biopsy from last week Endometrial culture returned with GBS Her pain has improved some, but she is still very tender and has a heavy vaginal d/c.  Iron deficiency anemia.    PLAN Discussed the option of admission and IV antiobiotics vs changing her antibiotics and close f/u. She desires admission.  Will admit to the hospital, there are no beds, will put her on a wait list.  Will add keflex 500 mg q6  hours until admission Call with fever prior to admission Will admit and treat with IV antibiotics (amp, gent, clinda) Will give iron transfusion while in the hospital   Over 1.5 hours in total patient care.

## 2019-08-29 NOTE — Telephone Encounter (Signed)
Burnice Logan, RN  08/29/2019 9:43 AM EDT    Spoke with Nile Riggs at [facility], confirmed Wound Culture GBS positive.  Dr. Talbert Nan updated.   Call placed to patient, left message with Horris Latino to call Sharee Pimple, RN at [facility].   See telephone encounter dated 08/29/19

## 2019-08-29 NOTE — Progress Notes (Signed)
GYNECOLOGY  VISIT   HPI: 44 y.o.   Single Black or African American Not Hispanic or Latino  female   G0P0000 with No LMP recorded. (Menstrual status: Irregular Periods).   here for follow up AUB and abdominal/pelvic pain. She has an enlarged fibroid uterus with prior h/o uterine artery embolization.  She was seen one week ago c/o 6 months of constant bleeding and throbbing pain in her lower abdomen. Her exam was concerning for a pelvic infection. endometrial biopsy returned with acute and chronic endometritis, no hyperplasia or malignancy. Endometrial culture returned with GBS.  Pap was unsatisfactory secondary to inflammation. GC/CT negative. She hasn't been sexually active x 4 years.    She was started on antibiotics last week for presumed PID. Her pelvic discomfort has gone from an 8/10 to a 4/10 in severity. She is having a large amount of vaginal discharge. Vaginal odor has improved, but the D/C is more. Her bleeding has been getting lighter, just mild spotting, occasional blood clots in the toilet. She is on Aygestin.  Hgb from 08/22/19 returned at 8.7 with a ferritin of 19  Ultrasound from 07/19/19: fibroid uterus. Overall uterine measurement 16.1 x 8.7 x 9.9 cm. 7.6 x 6.9 x 8 cm mass in the upper uterus c/w fibroid. Extends transmural and obscuring the endometrium.     GYNECOLOGIC HISTORY: No LMP recorded. (Menstrual status: Irregular Periods). Contraception: abstinence  Menopausal hormone therapy: none         OB History    Gravida  0   Para  0   Term  0   Preterm  0   AB  0   Living  0     SAB  0   TAB  0   Ectopic  0   Multiple  0   Live Births  0              Patient Active Problem List   Diagnosis Date Noted  . Paresthesias 07/14/2019  . Abnormal uterine bleeding 07/06/2019  . Iron deficiency anemia due to chronic blood loss 07/06/2019  . Type II diabetes mellitus ([place]) 07/03/2019  . Sleep apnea 05/02/2013  Doesn't have CPAP  Past Medical  History:  Diagnosis Date  . Abnormal uterine bleeding   . Anemia   . Diabetes mellitus without complication ([place])   . Fibroid   . Obesity     Past Surgical History:  Procedure Laterality Date  . BREAST REDUCTION SURGERY Bilateral   . GASTRIC BYPASS    . HYSTEROSCOPY WITH SALPINGOGRAM    . KIDNEY STONE SURGERY    . LAPAROSCOPIC GASTRIC SLEEVE RESECTION    . PANNICULECTOMY      Current Outpatient Medications  Medication Sig Dispense Refill  . doxycycline (VIBRAMYCIN) 100 MG capsule Take 1 capsule (100 mg total) by mouth 2 (two) times daily. Take BID for 14 days.  Take with food as can cause GI distress. 28 capsule 0  . ferrous sulfate 325 (65 FE) MG tablet Take 325 mg by mouth daily with breakfast.    . metFORMIN (GLUCOPHAGE-XR) 500 MG 24 hr tablet Take 1 tablet (500 mg total) by mouth daily with breakfast. 180 tablet 3  . metroNIDAZOLE (FLAGYL) 500 MG tablet Take 1 tablet (500 mg total) by mouth 2 (two) times daily. 28 tablet 0  . norethindrone (AYGESTIN) 5 MG tablet Take 1 tablet (5 mg total) by mouth daily. 30 tablet 1   No current facility-administered medications for this visit.  ALLERGIES: Patient has no known allergies.  Family History  Problem Relation Age of Onset  . Asthma Mother   . Diabetes Mother   . Cancer Sister     Social History   Socioeconomic History  . Marital status: Single    Spouse name: Not on file  . Number of children: Not on file  . Years of education: Not on file  . Highest education level: Not on file  Occupational History  . Not on file  Tobacco Use  . Smoking status: Never Smoker  . Smokeless tobacco: Never Used  Substance and Sexual Activity  . Alcohol use: Not Currently  . Drug use: Never  . Sexual activity: Not Currently    Partners: Female    Birth control/protection: Abstinence    Comment: last SA x9 years ago per patient  Other Topics Concern  . Not on file  Social History Narrative  . Not on file   Social  Determinants of Health   Financial Resource Strain:   . Difficulty of Paying Living Expenses:   Food Insecurity:   . Worried About Charity fundraiser in the Last Year:   . Arboriculturist in the Last Year:   Transportation Needs:   . Film/video editor (Medical):   Marland Kitchen Lack of Transportation (Non-Medical):   Physical Activity:   . Days of Exercise per Week:   . Minutes of Exercise per Session:   Stress:   . Feeling of Stress :   Social Connections:   . Frequency of Communication with Friends and Family:   . Frequency of Social Gatherings with Friends and Family:   . Attends Religious Services:   . Active Member of Clubs or Organizations:   . Attends Archivist Meetings:   Marland Kitchen Marital Status:   Intimate Partner Violence:   . Fear of Current or Ex-Partner:   . Emotionally Abused:   Marland Kitchen Physically Abused:   . Sexually Abused:     Review of Systems  All other systems reviewed and are negative.   PHYSICAL EXAMINATION:    There were no vitals taken for this visit.    General appearance: alert, cooperative and appears stated age Neck: no adenopathy, supple, symmetrical, trachea midline and thyroid normal to inspection and palpation Heart: regular rate and rhythm Lungs: CTAB Abdomen: soft, uterus is 16 week sized and extremely tender Extremities: normal, atraumatic, no cyanosis Skin: normal color, texture and turgor, no rashes or lesions Lymph: normal cervical supraclavicular and inguinal nodes Neurologic: grossly normal   Pelvic: External genitalia:  no lesions              Urethra:  normal appearing urethra with no masses, tenderness or lesions              Bartholins and Skenes: normal                 Vagina: normal appearing vagina with copious amounts of watery vaginal discharge              Cervix: +/- CMT              Bimanual Exam:  Uterus:  16 week sized, extremely tender, mobile.               Adnexa: no masses bilateral tenderness                Chaperone was present for exam.  ASSESSMENT Pelvic infection in patient with an enlarged fibroid  uterus. Acute and chronic endometritis on endometrial biopsy from last week Endometrial culture returned with GBS Her pain has improved some, but she is still very tender and has a heavy vaginal d/c.  Iron deficiency anemia.    PLAN Discussed the option of admission and IV antiobiotics vs changing her antibiotics and close f/u. She desires admission.  Will admit to the hospital, there are no beds, will put her on a wait list.  Will add keflex 500 mg q6  hours until admission Call with fever prior to admission Will admit and treat with IV antibiotics (amp, gent, clinda) Will give iron transfusion while in the hospital   Over 1.5 hours in total patient care.

## 2019-08-29 NOTE — Telephone Encounter (Signed)
Patient left message on answering machine returning call. She is at work and not able to answer phone but will be in for appointment this afternoon.

## 2019-08-29 NOTE — Telephone Encounter (Signed)
Routing to Dr. Rosann Auerbach. Patient has not been notified of results, plan to discuss at [place] this afternoon.

## 2019-08-29 NOTE — Telephone Encounter (Signed)
-----   Message from Salvadore Dom, MD sent at 08/28/2019  1:07 PM EDT ----- Please check with the lab. Her culture returned with no organisms, but under the comment it discusses GBS. Does she have GBS? Please let the patient know that her endometrial biopsy returned c/w endometritis (inflammation/infection), negative for precancerous changes. The pap was unsatisfactory because of the inflammation/infection. It was negative for GC/CT and trich.  She will need a repeat pap when her infection has cleared. It is possible that I will be able to do it at her visit tomorrow.  Please see how she is feeling.  CC: Dr Janus Molder

## 2019-08-30 ENCOUNTER — Telehealth: Payer: Self-pay

## 2019-08-30 NOTE — Progress Notes (Deleted)
GYNECOLOGY  VISIT   HPI: 44 y.o.   Single Black or African American Not Hispanic or Latino  female   G0P0000 with No LMP recorded. (Menstrual status: Irregular Periods).   here for   Follow up   GYNECOLOGIC HISTORY: No LMP recorded. (Menstrual status: Irregular Periods). Contraception: abstinence  Menopausal hormone therapy: none         OB History    Gravida  0   Para  0   Term  0   Preterm  0   AB  0   Living  0     SAB  0   TAB  0   Ectopic  0   Multiple  0   Live Births  0              Patient Active Problem List   Diagnosis Date Noted  . Paresthesias 07/14/2019  . Abnormal uterine bleeding 07/06/2019  . Iron deficiency anemia due to chronic blood loss 07/06/2019  . Type II diabetes mellitus ([place]) 07/03/2019  . Sleep apnea 05/02/2013    Past Medical History:  Diagnosis Date  . Abnormal uterine bleeding   . Anemia   . Diabetes mellitus without complication ([place])   . Fibroid   . Obesity     Past Surgical History:  Procedure Laterality Date  . BREAST REDUCTION SURGERY Bilateral   . GASTRIC BYPASS    . HYSTEROSCOPY WITH SALPINGOGRAM    . KIDNEY STONE SURGERY    . LAPAROSCOPIC GASTRIC SLEEVE RESECTION    . PANNICULECTOMY      Current Outpatient Medications  Medication Sig Dispense Refill  . cephALEXin (KEFLEX) 500 MG capsule Take 1 capsule (500 mg total) by mouth 4 (four) times daily. Take QID for 7 days. 28 capsule 0  . doxycycline (VIBRAMYCIN) 100 MG capsule Take 1 capsule (100 mg total) by mouth 2 (two) times daily. Take BID for 14 days.  Take with food as can cause GI distress. 28 capsule 0  . ferrous sulfate 325 (65 FE) MG tablet Take 325 mg by mouth daily with breakfast.    . metFORMIN (GLUCOPHAGE-XR) 500 MG 24 hr tablet Take 1 tablet (500 mg total) by mouth daily with breakfast. 180 tablet 3  . metroNIDAZOLE (FLAGYL) 500 MG tablet Take 1 tablet (500 mg total) by mouth 2 (two) times daily. 28 tablet 0  . norethindrone (AYGESTIN) 5 MG  tablet Take 1 tablet (5 mg total) by mouth daily. 30 tablet 1   No current facility-administered medications for this visit.     ALLERGIES: Patient has no known allergies.  Family History  Problem Relation Age of Onset  . Asthma Mother   . Diabetes Mother   . Cancer Sister     Social History   Socioeconomic History  . Marital status: Single    Spouse name: Not on file  . Number of children: Not on file  . Years of education: Not on file  . Highest education level: Not on file  Occupational History  . Not on file  Tobacco Use  . Smoking status: Never Smoker  . Smokeless tobacco: Never Used  Substance and Sexual Activity  . Alcohol use: Not Currently  . Drug use: Never  . Sexual activity: Not Currently    Partners: Female    Birth control/protection: Abstinence    Comment: last SA x9 years ago per patient  Other Topics Concern  . Not on file  Social History Narrative  . Not on file  Social Determinants of Health   Financial Resource Strain:   . Difficulty of Paying Living Expenses:   Food Insecurity:   . Worried About Charity fundraiser in the Last Year:   . Arboriculturist in the Last Year:   Transportation Needs:   . Film/video editor (Medical):   Marland Kitchen Lack of Transportation (Non-Medical):   Physical Activity:   . Days of Exercise per Week:   . Minutes of Exercise per Session:   Stress:   . Feeling of Stress :   Social Connections:   . Frequency of Communication with Friends and Family:   . Frequency of Social Gatherings with Friends and Family:   . Attends Religious Services:   . Active Member of Clubs or Organizations:   . Attends Archivist Meetings:   Marland Kitchen Marital Status:   Intimate Partner Violence:   . Fear of Current or Ex-Partner:   . Emotionally Abused:   Marland Kitchen Physically Abused:   . Sexually Abused:     Review of Systems  Constitutional: Negative.   HENT: Negative.   Eyes: Negative.   Respiratory: Negative.   Cardiovascular:  Negative.   Gastrointestinal: Negative.   Genitourinary: Negative.   Musculoskeletal: Negative.   Skin: Negative.   Neurological: Negative.   Endo/Heme/Allergies: Negative.   Psychiatric/Behavioral: Negative.     PHYSICAL EXAMINATION:    There were no vitals taken for this visit.    General appearance: alert, cooperative and appears stated age Neck: no adenopathy, supple, symmetrical, trachea midline and thyroid {CHL AMB PHY EX THYROID NORM DEFAULT:[phone]::"normal to inspection and palpation"} Breasts: {Exam; breast:13139::"normal appearance, no masses or tenderness"} Abdomen: soft, non-tender; non distended, no masses,  no organomegaly  Pelvic: External genitalia:  no lesions              Urethra:  normal appearing urethra with no masses, tenderness or lesions              Bartholins and Skenes: normal                 Vagina: normal appearing vagina with normal color and discharge, no lesions              Cervix: {CHL AMB PHY EX CERVIX NORM DEFAULT:[phone]::"no lesions"}              Bimanual Exam:  Uterus:  {CHL AMB PHY EX UTERUS NORM DEFAULT:[phone]::"normal size, contour, position, consistency, mobility, non-tender"}              Adnexa: {CHL AMB PHY EX ADNEXA NO MASS DEFAULT:[phone]::"no mass, fullness, tenderness"}              Rectovaginal: {yes no:[id number]}.  Confirms.              Anus:  normal sphincter tone, no lesions  Chaperone was present for exam.  ASSESSMENT     PLAN    An After Visit Summary was printed and given to the patient.  *** minutes face to face time of which over 50% was spent in counseling.

## 2019-08-30 NOTE — Telephone Encounter (Addendum)
Spoke with patient. Patient calling to provide update.  Reports she started oral abx on 08/29/19. Denies fever/chills, feels "sweaty and clammy". Reports heavy yellow vaginal d/c and vaginal pressure.  Patient asking if she will still be admitted to hospital for IV abx?   Advised patient I will review with Dr. Sabra Heck and f/u. Patient states she does not have a phone, return call to her friend Lilia Pro at [phone] and leave a detailed message with Lilia Pro or on voicemail.

## 2019-08-30 NOTE — Telephone Encounter (Signed)
Call reviewed with Dr. Sabra Heck.   Call returned to patient at number provided and left detailed message as requested. Instructed patient to continue oral antibiotics. If any new symptoms develop such as fever greater than 100.5 or severe pain, go directly to local ER for further evaluation. F/u recommended with Dr. Talbert Nan on 09/02/19.  Advised our office phones go off at 4pm, so I have scheduled an OV with Dr. Talbert Nan for 5/3 at 8:30am. Requested patient return call to office to confirm appt and message received.   Routing to Dr. Lestine Box.   Cc: Dr. Talbert Nan

## 2019-08-30 NOTE — Telephone Encounter (Signed)
Patient is returning call. Patient is feeling fatigued and pressure in vaginal area. Started antibiotics that were given (08/29/19) but still having heavy discharge.

## 2019-08-30 NOTE — Telephone Encounter (Signed)
Attempted to reach patient to get an update on how she is feeling today. No answer, left a voicemail asking for return call to the office. Call to patient's friend at [phone], okay to call per patient, there was no answer.

## 2019-08-30 NOTE — Telephone Encounter (Signed)
Patient returned call. Advised again as seen below per Dr. Sabra Heck.  Patient states she can not be seen in office on Monday due to her work schedule, requesting another appt. Patient asking of she will be a direct admit to the hospital? Again advised patient she will continue oral abx, if any new symptoms develop or symptoms worsen, seek evaluation at local ER. Call was dropped by patient. Call returned to number previously provided, female by the name of Lilia Pro answered, was advised Cassandra Kim is not with her, will have her return call to office. I advised our office phones go off in 5 min, please have her return call to office ASAP.   OV for 5/3 cancelled.   Routing to Dr. Sabra Heck

## 2019-09-02 ENCOUNTER — Telehealth: Payer: Self-pay

## 2019-09-02 ENCOUNTER — Inpatient Hospital Stay (HOSPITAL_COMMUNITY)
Admission: EM | Admit: 2019-09-02 | Discharge: 2019-09-05 | DRG: 759 | Disposition: A | Discharge status: Home / Self Care | Payer: BC Managed Care – PPO | Attending: Obstetrics & Gynecology | Admitting: Obstetrics & Gynecology

## 2019-09-02 ENCOUNTER — Ambulatory Visit: Payer: Self-pay | Admitting: Obstetrics and Gynecology

## 2019-09-02 ENCOUNTER — Emergency Department (HOSPITAL_COMMUNITY): Payer: BC Managed Care – PPO

## 2019-09-02 ENCOUNTER — Other Ambulatory Visit: Payer: Self-pay

## 2019-09-02 ENCOUNTER — Encounter (HOSPITAL_COMMUNITY): Payer: Self-pay | Admitting: *Deleted

## 2019-09-02 DIAGNOSIS — N711 Chronic inflammatory disease of uterus: Secondary | ICD-10-CM | POA: Diagnosis present

## 2019-09-02 DIAGNOSIS — B951 Streptococcus, group B, as the cause of diseases classified elsewhere: Secondary | ICD-10-CM | POA: Diagnosis present

## 2019-09-02 DIAGNOSIS — Z9884 Bariatric surgery status: Secondary | ICD-10-CM

## 2019-09-02 DIAGNOSIS — Z833 Family history of diabetes mellitus: Secondary | ICD-10-CM

## 2019-09-02 DIAGNOSIS — Z87442 Personal history of urinary calculi: Secondary | ICD-10-CM

## 2019-09-02 DIAGNOSIS — R102 Pelvic and perineal pain: Secondary | ICD-10-CM | POA: Diagnosis not present

## 2019-09-02 DIAGNOSIS — D500 Iron deficiency anemia secondary to blood loss (chronic): Secondary | ICD-10-CM | POA: Diagnosis not present

## 2019-09-02 DIAGNOSIS — Z7984 Long term (current) use of oral hypoglycemic drugs: Secondary | ICD-10-CM

## 2019-09-02 DIAGNOSIS — E1165 Type 2 diabetes mellitus with hyperglycemia: Secondary | ICD-10-CM | POA: Diagnosis present

## 2019-09-02 DIAGNOSIS — Z79899 Other long term (current) drug therapy: Secondary | ICD-10-CM

## 2019-09-02 DIAGNOSIS — D259 Leiomyoma of uterus, unspecified: Secondary | ICD-10-CM | POA: Diagnosis present

## 2019-09-02 DIAGNOSIS — G4730 Sleep apnea, unspecified: Secondary | ICD-10-CM | POA: Diagnosis present

## 2019-09-02 DIAGNOSIS — N710 Acute inflammatory disease of uterus: Secondary | ICD-10-CM | POA: Diagnosis not present

## 2019-09-02 DIAGNOSIS — Z825 Family history of asthma and other chronic lower respiratory diseases: Secondary | ICD-10-CM

## 2019-09-02 DIAGNOSIS — Z20822 Contact with and (suspected) exposure to covid-19: Secondary | ICD-10-CM | POA: Diagnosis present

## 2019-09-02 DIAGNOSIS — N921 Excessive and frequent menstruation with irregular cycle: Secondary | ICD-10-CM | POA: Diagnosis not present

## 2019-09-02 DIAGNOSIS — N719 Inflammatory disease of uterus, unspecified: Secondary | ICD-10-CM

## 2019-09-02 DIAGNOSIS — N939 Abnormal uterine and vaginal bleeding, unspecified: Secondary | ICD-10-CM | POA: Diagnosis not present

## 2019-09-02 DIAGNOSIS — N739 Female pelvic inflammatory disease, unspecified: Secondary | ICD-10-CM | POA: Diagnosis present

## 2019-09-02 LAB — BASIC METABOLIC PANEL
Anion gap: 8 (ref 5–15)
BUN: 13 mg/dL (ref 6–20)
CO2: 23 mmol/L (ref 22–32)
Calcium: 8.7 mg/dL — ABNORMAL LOW (ref 8.9–10.3)
Chloride: 106 mmol/L (ref 98–111)
Creatinine, Ser: 0.53 mg/dL (ref 0.44–1.00)
GFR calc Af Amer: >60 mL/min (ref >60)
GFR calc non Af Amer: >60 mL/min (ref >60)
Glucose, Bld: 138 mg/dL — ABNORMAL HIGH (ref 70–99)
Potassium: 3.8 mmol/L (ref 3.5–5.1)
Sodium: 137 mmol/L (ref 135–145)

## 2019-09-02 LAB — CBC
HCT: 34.5 % — ABNORMAL LOW (ref 36.0–46.0)
Hemoglobin: 10.1 g/dL — ABNORMAL LOW (ref 12.0–15.0)
MCH: 20 pg — ABNORMAL LOW (ref 26.0–34.0)
MCHC: 29.3 g/dL — ABNORMAL LOW (ref 30.0–36.0)
MCV: 68.3 fL — ABNORMAL LOW (ref 80.0–100.0)
Platelets: 380 K/uL (ref 150–400)
RBC: 5.05 MIL/uL (ref 3.87–5.11)
RDW: 29.9 % — ABNORMAL HIGH (ref 11.5–15.5)
WBC: 14.2 K/uL — ABNORMAL HIGH (ref 4.0–10.5)
nRBC: 0 % (ref 0.0–0.2)

## 2019-09-02 LAB — URINALYSIS, ROUTINE W REFLEX MICROSCOPIC
Bilirubin Urine: NEGATIVE
Glucose, UA: 50 mg/dL — AB
Ketones, ur: NEGATIVE mg/dL
Nitrite: NEGATIVE
Protein, ur: NEGATIVE mg/dL
RBC / HPF: >50 RBC/hpf — ABNORMAL HIGH (ref 0–5)
Specific Gravity, Urine: 1.019 (ref 1.005–1.030)
pH: 6 (ref 5.0–8.0)

## 2019-09-02 LAB — WET PREP, GENITAL
Clue Cells Wet Prep HPF POC: NONE SEEN
Sperm: NONE SEEN
Trich, Wet Prep: NONE SEEN
Yeast Wet Prep HPF POC: NONE SEEN

## 2019-09-02 LAB — I-STAT BETA HCG BLOOD, ED (MC, WL, AP ONLY): I-stat hCG, quantitative: <5 m[IU]/mL (ref <5)

## 2019-09-02 LAB — BACTERIAL VAGINOSIS, NAA

## 2019-09-02 MED ORDER — SODIUM CHLORIDE (PF) 0.9 % IJ SOLN
INTRAMUSCULAR | Status: AC
  Filled 2019-09-02: qty 50

## 2019-09-02 MED ORDER — IOHEXOL 300 MG/ML  SOLN
100.0000 mL | Freq: Once | INTRAMUSCULAR | Status: AC | PRN
  Administered 2019-09-02: 100 mL via INTRAVENOUS

## 2019-09-02 MED ORDER — SODIUM CHLORIDE 0.9% FLUSH
3.0000 mL | Freq: Once | INTRAVENOUS | Status: AC
  Administered 2019-09-03: 02:00:00 3 mL via INTRAVENOUS

## 2019-09-02 NOTE — ED Triage Notes (Signed)
Pt reports that she has had vaginal bleeding for 7 months (reports 2 larges uterine fibroids).  Pt says that her doctor told her to come here for evaluation, she last saw her doctor last week for the same, medications are not working. She is also on abx for an infection. Using 2 pads an hour, pain in the mid lower abdomen, fatigue. Denies fevers.

## 2019-09-02 NOTE — Telephone Encounter (Signed)
Call returned to patient at number previously provided by patient, no answer. Mailbox full, unable to leave message.

## 2019-09-02 NOTE — Telephone Encounter (Signed)
Spoke with patient after review with Dr.Jertson. Advised patient to be seen in the ER due to the amount she is bleeding. Advised this is unsafe and can turn into a medical emergency based on how heavy she is bleeding. Patient does not want to go through the ER for treatment. Only wants to go if she is directly admitted due to [place]. Advised patient the quickest way to be seen is to go to the ER for evaluation. Patient states that she needs to speak with her mother and if she does not go she will be seen in our office for her appointment tomorrow at 3 pm with Dr.Jertson. Advised Dr.Jertson has unexpectedly had to cancel her afternoon after 3 pm tomorrow. Patient is requesting to see another MD. Advised again not recommended to wait 24 or more hours to be seen. Offered earliest appointment on 09/04/2019 at 8 am and 8:30 am with Dr.Silva. Patient declines both as she needs an appointment after 3 pm due to her job. Offered 09/04/2019 at 4:30 pm with Dr.Silva. Patient states she will need to consider her options call back. Patient then disconnected the phone call.

## 2019-09-02 NOTE — Telephone Encounter (Signed)
Patient would like to reschedule follow up appointment that was canceled for today (09/02/19). Patient states she needs an afternoon appointment at 3pm or later. Need triage to assist. Patient states it is okay to leave detailed message on [phone].

## 2019-09-02 NOTE — ED Provider Notes (Signed)
[facility] DEPT Provider Note   CSN: [id number] Arrival date & time: 09/02/19  1858     History Chief Complaint  Patient presents with  . Vaginal Bleeding    Cassandra Kim is a 44 y.o. female who presents for evaluation of pelvic pain and bleeding.  Patient has had about 7 months of bleeding.  She is not been sexually active in about 9 years.  She was found to be anemic, followed up recently with Dr. Talbert Nan.  She had an endometrial biopsy on the 22nd and had copious bloody and opaque discharge from the cervix which was concerning.  She was started on doxycycline and given Rocephin at the office.  She is continue to have pelvic pain and bleeding.  Her endometrial biopsy came back positive with cultures of GBS and there is concern for endometritis.  Patient was started on amoxicillin but has not had much improvement.  She was going to follow-up in the office but her symptoms became worse and she came to the emergency department for evaluation.  HPI     Past Medical History:  Diagnosis Date  . Abnormal uterine bleeding   . Anemia   . Diabetes mellitus without complication ([place])   . Fibroid   . Obesity     Patient Active Problem List   Diagnosis Date Noted  . Paresthesias 07/14/2019  . Abnormal uterine bleeding 07/06/2019  . Iron deficiency anemia due to chronic blood loss 07/06/2019  . Type II diabetes mellitus ([place]) 07/03/2019  . Sleep apnea 05/02/2013    Past Surgical History:  Procedure Laterality Date  . BREAST REDUCTION SURGERY Bilateral   . GASTRIC BYPASS    . HYSTEROSCOPY WITH SALPINGOGRAM    . KIDNEY STONE SURGERY    . LAPAROSCOPIC GASTRIC SLEEVE RESECTION    . PANNICULECTOMY       OB History    Gravida  0   Para  0   Term  0   Preterm  0   AB  0   Living  0     SAB  0   TAB  0   Ectopic  0   Multiple  0   Live Births  0           Family History  Problem Relation Age of Onset  . Asthma Mother   .  Diabetes Mother   . Cancer Sister     Social History   Tobacco Use  . Smoking status: Never Smoker  . Smokeless tobacco: Never Used  Substance Use Topics  . Alcohol use: Not Currently  . Drug use: Never    Home Medications Prior to Admission medications   Medication Sig Start Date End Date Taking? Authorizing Provider  acetaminophen (TYLENOL) 500 MG tablet Take 500 mg by mouth every 6 (six) hours as needed for moderate pain.   Yes [provider]  cephALEXin (KEFLEX) 500 MG capsule Take 1 capsule (500 mg total) by mouth 4 (four) times daily. Take QID for 7 days. 08/29/19  Yes Salvadore Dom, MD  doxycycline (VIBRAMYCIN) 100 MG capsule Take 1 capsule (100 mg total) by mouth 2 (two) times daily. Take BID for 14 days.  Take with food as can cause GI distress. 08/22/19  Yes Salvadore Dom, MD  ferrous sulfate 325 (65 FE) MG tablet Take 650 mg by mouth 2 (two) times daily with a meal.    Yes [provider]  metFORMIN (GLUCOPHAGE-XR) 500 MG 24 hr tablet  Take 1 tablet (500 mg total) by mouth daily with breakfast. 07/06/19  Yes Autry-Lott, Naaman Plummer, DO  metroNIDAZOLE (FLAGYL) 500 MG tablet Take 1 tablet (500 mg total) by mouth 2 (two) times daily. 08/22/19  Yes Salvadore Dom, MD  norethindrone (AYGESTIN) 5 MG tablet Take 1 tablet (5 mg total) by mouth daily. 08/22/19  Yes Salvadore Dom, MD    Allergies    Patient has no known allergies.  Review of Systems   Review of Systems Ten systems reviewed and are negative for acute change, except as noted in the HPI.   Physical Exam Updated Vital Signs BP (!) 182/96 (BP Location: Right Arm)   Pulse 82   Temp 98.1 F (36.7 C) (Oral)   Resp 18   SpO2 100%   Physical Exam Vitals and nursing note reviewed.  Constitutional:      General: She is not in acute distress.    Appearance: She is well-developed. She is not diaphoretic.  HENT:     Head: Normocephalic and atraumatic.  Eyes:     General: No  scleral icterus.    Conjunctiva/sclera: Conjunctivae normal.  Cardiovascular:     Rate and Rhythm: Normal rate and regular rhythm.     Heart sounds: Normal heart sounds. No murmur. No friction rub. No gallop.   Pulmonary:     Effort: Pulmonary effort is normal. No respiratory distress.     Breath sounds: Normal breath sounds.  Abdominal:     General: Bowel sounds are normal. There is no distension.     Palpations: Abdomen is soft. There is no mass.     Tenderness: There is no abdominal tenderness. There is no guarding.  Genitourinary:    Comments: Normal external female genitalia.  Vagina without abnormality.  There is bloody, opaque creamy discharge from the cervix.  Patient is exquisitely tender to palpation on bimanual examination.  Full examination is limited due to body habitus. Musculoskeletal:     Cervical back: Normal range of motion.  Skin:    General: Skin is warm and dry.  Neurological:     Mental Status: She is alert and oriented to person, place, and time.  Psychiatric:        Behavior: Behavior normal.     ED Results / Procedures / Treatments   Labs (all labs ordered are listed, but only abnormal results are displayed) Labs Reviewed  WET PREP, GENITAL - Abnormal; Notable for the following components:      Result Value   WBC, Wet Prep HPF POC PRESENT (*)    All other components within normal limits  BASIC METABOLIC PANEL - Abnormal; Notable for the following components:   Glucose, Bld 138 (*)    Calcium 8.7 (*)    All other components within normal limits  CBC - Abnormal; Notable for the following components:   WBC 14.2 (*)    Hemoglobin 10.1 (*)    HCT 34.5 (*)    MCV 68.3 (*)    MCH 20.0 (*)    MCHC 29.3 (*)    RDW 29.9 (*)    All other components within normal limits  URINALYSIS, ROUTINE W REFLEX MICROSCOPIC - Abnormal; Notable for the following components:   APPearance HAZY (*)    Glucose, UA 50 (*)    Hgb urine dipstick LARGE (*)    Leukocytes,Ua  LARGE (*)    RBC / HPF >50 (*)    Bacteria, UA RARE (*)    All other components within  normal limits  I-STAT BETA HCG BLOOD, ED ([facility], [facility], [facility] ONLY)  I-STAT BETA HCG BLOOD, ED ([facility], [facility], [facility] ONLY)  GC/CHLAMYDIA PROBE AMP ([place]) NOT AT [facility]    EKG None  Radiology No results found.  Procedures Procedures (including critical care time)  Medications Ordered in ED Medications  sodium chloride flush (NS) 0.9 % injection 3 mL (has no administration in time range)    ED Course  I have reviewed the triage vital signs and the nursing notes.  Pertinent labs & imaging results that were available during my care of the patient were reviewed by me and considered in my medical decision making (see chart for details).    MDM Rules/Calculators/A&P                     44 year old female here with complaint of pelvic pain and bleeding.  Review of EMR shows that she spoke with one of the triage nurses was complaining of heavy bleeding, lightheadedness and pain was told to come to the emergency department.  On physical examination the patient has tender  Her wet prep is positive for white blood cells.  Urine appears contaminatedWithout evidence of infection.  Her CMP shows mild hyperglycemia of insignificant value.  I discussed the case with Dr. Edwinna Areola on-call for her practice.  She will come and admit the patient for presumed endometritis and start her on IV antibiotics.  She asked that I perform a CT abdomen pelvis.  The image is pending.  The patient is otherwise hemodynamically stable and afebrile here.  She is appropriate for admission at this time. Final Clinical Impression(s) / ED Diagnoses Final diagnoses:  Endometritis    Rx / DC Orders ED Discharge Orders    None       Margarita Mail, PA-C 09/02/19 2306    Virgel Manifold, MD 09/04/19 1736

## 2019-09-02 NOTE — H&P (Signed)
Cassandra Kim is an 44 y.o. female G77 SAAF with hx of irregular and heavy bleeding over the past seven months who came to there ER due to increased vaginal bleeding over the past two days.  Pt has hx of irregular bleeding, abdominal and pelvic pain/heaviness who was initially seen by Dr. Sumner Boast on 08/22/2019.  Due to pain on physical exam, she was given and IM rocephin dosage and doxycyline and flagyl x 14 days.  She was started on aygestin 5mg  daily and endometrial biopsy with culture was obtained due to creamy appearing vaginal bleeding/discharge.    She was then seen in follow up on 08/29/2019.  Pt underwent endometrial biopsy .  Biopsy did not show abnormal cells but culture of endometrial curetting showed GBS.  She was started on amoxicillin after admission for IV antibiotics was not possible due to hospital bed availability.    Pt called the office at the end of the day reporting increased bleeding and was advised to be seen in the ER.  She states she is having light headed sensation but denies SOB or palpitations.  She is wearing two pads and changing this every few hours.  She is not having large clots.  She continues to have pelvic throbbing/pain sensation.  Denies fever.    Pertinent Gynecological History: Menses: heavy at times and irregular Contraception: abstinence DES exposure: denies Sexually transmitted diseases: no past history Previous GYN Procedures: none  Last mammogram: never had one  Last pap: unsatisfactory due to obscuring inflammation, neg tri, neg HR HPV, neg GC/Chl OB History: G0, P0   Past Medical History:  Diagnosis Date  . Abnormal uterine bleeding   . Anemia   . Diabetes mellitus without complication ([place])   . Fibroid   . Obesity     Past Surgical History:  Procedure Laterality Date  . BREAST REDUCTION SURGERY Bilateral   . HYSTEROSCOPY WITH SALPINGOGRAM    . KIDNEY STONE SURGERY    . LAPAROSCOPIC GASTRIC SLEEVE RESECTION    . PANNICULECTOMY       Family History  Problem Relation Age of Onset  . Asthma Mother   . Diabetes Mother   . Cancer Sister     Social History:  reports that she has never smoked. She has never used smokeless tobacco. She reports previous alcohol use. She reports that she does not use drugs.  Allergies: No Known Allergies  (Not in a hospital admission)   Review of Systems  Constitutional: Positive for fatigue.  Respiratory: Negative.   Cardiovascular: Negative.   Gastrointestinal: Negative.   Genitourinary: Positive for menstrual problem, pelvic pain, vaginal bleeding and vaginal discharge.  Musculoskeletal: Negative.   Skin: Negative.   Neurological: Negative.   Hematological: Negative.   Psychiatric/Behavioral: Negative.     Blood pressure (!) 182/96, pulse 82, temperature 98.1 F (36.7 C), temperature source Oral, resp. rate 18, SpO2 100 %. Physical Exam  Constitutional: She is oriented to person, place, and time. She appears well-developed and well-nourished.  HENT:  Head: Normocephalic and atraumatic.  Neck: No thyromegaly present.  Cardiovascular: Normal rate and regular rhythm.  Respiratory: Effort normal and breath sounds normal.  GI: Soft. Bowel sounds are normal. She exhibits no mass. There is abdominal tenderness. There is no rebound and no guarding.  Genitourinary:    Genitourinary Comments: Uterus about 16 weeks, moderately tender to palpation Vaginal bleeding with some creamy appearing d/c   Musculoskeletal:        General: No edema. Normal range of  motion.     Cervical back: Normal range of motion and neck supple.  Neurological: She is alert and oriented to person, place, and time.  Skin: Skin is warm and dry.  Psychiatric: She has a normal mood and affect.    Results for orders placed or performed during the hospital encounter of 09/02/19 (from the past 24 hour(s))  Urinalysis, Routine w reflex microscopic     Status: Abnormal   Collection Time: 09/02/19  7:43 PM   Result Value Ref Range   Color, Urine YELLOW YELLOW   APPearance HAZY (A) CLEAR   Specific Gravity, Urine 1.019 1.005 - 1.030   pH 6.0 5.0 - 8.0   Glucose, UA 50 (A) NEGATIVE mg/dL   Hgb urine dipstick LARGE (A) NEGATIVE   Bilirubin Urine NEGATIVE NEGATIVE   Ketones, ur NEGATIVE NEGATIVE mg/dL   Protein, ur NEGATIVE NEGATIVE mg/dL   Nitrite NEGATIVE NEGATIVE   Leukocytes,Ua LARGE (A) NEGATIVE   RBC / HPF >50 (H) 0 - 5 RBC/hpf   WBC, UA 11-20 0 - 5 WBC/hpf   Bacteria, UA RARE (A) NONE SEEN   Squamous Epithelial / LPF 0-5 0 - 5   Mucus PRESENT   Basic metabolic panel     Status: Abnormal   Collection Time: 09/02/19  8:00 PM  Result Value Ref Range   Sodium 137 135 - 145 mmol/L   Potassium 3.8 3.5 - 5.1 mmol/L   Chloride 106 98 - 111 mmol/L   CO2 23 22 - 32 mmol/L   Glucose, Bld 138 (H) 70 - 99 mg/dL   BUN 13 6 - 20 mg/dL   Creatinine, Ser 0.53 0.44 - 1.00 mg/dL   Calcium 8.7 (L) 8.9 - 10.3 mg/dL   GFR calc non Af Amer >60 >60 mL/min   GFR calc Af Amer >60 >60 mL/min   Anion gap 8 5 - 15  CBC     Status: Abnormal   Collection Time: 09/02/19  8:00 PM  Result Value Ref Range   WBC 14.2 (H) 4.0 - 10.5 K/uL   RBC 5.05 3.87 - 5.11 MIL/uL   Hemoglobin 10.1 (L) 12.0 - 15.0 g/dL   HCT 34.5 (L) 36.0 - 46.0 %   MCV 68.3 (L) 80.0 - 100.0 fL   MCH 20.0 (L) 26.0 - 34.0 pg   MCHC 29.3 (L) 30.0 - 36.0 g/dL   RDW 29.9 (H) 11.5 - 15.5 %   Platelets 380 150 - 400 K/uL   nRBC 0.0 0.0 - 0.2 %  I-Stat beta hCG blood, ED     Status: None   Collection Time: 09/02/19  8:05 PM  Result Value Ref Range   I-stat hCG, quantitative <5.0 <5 mIU/mL   Comment 3          Wet prep, genital     Status: Abnormal   Collection Time: 09/02/19 10:23 PM   Specimen: Genital  Result Value Ref Range   Yeast Wet Prep HPF POC NONE SEEN NONE SEEN   Trich, Wet Prep NONE SEEN NONE SEEN   Clue Cells Wet Prep HPF POC NONE SEEN NONE SEEN   WBC, Wet Prep HPF POC PRESENT (A) NONE SEEN   Sperm NONE SEEN     CT  ABDOMEN PELVIS W CONTRAST  Result Date: 09/02/2019 CLINICAL DATA:  Abdominal pain, vaginal EXAM: CT ABDOMEN AND PELVIS WITH CONTRAST TECHNIQUE: Multidetector CT imaging of the abdomen and pelvis was performed using the standard protocol following bolus administration of intravenous contrast. CONTRAST:  156mL OMNIPAQUE IOHEXOL 300 MG/ML  SOLN COMPARISON:  Pelvic ultrasound 07/19/2019 FINDINGS: Lower chest: Lung bases are clear. No effusions. Heart is normal size. Hepatobiliary: Gallstones noted within the gallbladder. No focal hepatic abnormality or biliary ductal dilatation. Pancreas: No focal abnormality or ductal dilatation. Spleen: No focal abnormality.  Normal size. Adrenals/Urinary Tract: No adrenal abnormality. No focal renal abnormality. No stones or hydronephrosis. Urinary bladder is unremarkable. Stomach/Bowel: Normal appendix. Stomach, large and small bowel grossly unremarkable. Prior gastric sleeve. Vascular/Lymphatic: No evidence of aneurysm or adenopathy. Reproductive: Large partially calcified fibroid within the fundus measuring up to 8.4 cm on the sagittal image. This appears to extend to the endometrium in the endometrium appears thickened measuring 31 mm with internal soft tissue density, possibly blood products. No adnexal mass. Other: No free fluid or free air. Musculoskeletal: No acute bony abnormality. IMPRESSION: Enlarged fibroid uterus with large central 8.4 cm partially calcified fibroid. Thickened endometrium measuring up to 31 mm with internal soft tissue, possibly blood products/clot. Endometrium was not well visualized on prior ultrasound due to the calcified fibroid. Cholelithiasis. Prior gastric sleeve. Normal appendix. Electronically Signed   By: Rolm Baptise M.D.   On: 09/02/2019 23:13    Assessment/Plan: 44 yo G0 SAAF with enlarged fibroid uterus, irregular bleeding with iron deficiency anemia, h/o gastric sleeve with significant weight loss, diabetes, elevated WBC ct  today.  Will admit for IV antibiotic therapy (gent/amp/clinda), pain management (pt declines any narcotics due to prior addiction), iron infusion.  CT scan done in ER tonight.  Declines Covid 19 vaccination at this time.  Megan Salon 09/02/2019, 11:46 PM

## 2019-09-02 NOTE — Telephone Encounter (Signed)
Call returned to patient at number provided, no answer. Mailbox full, unable to leave message.   OV scheduled for 5/4 at 4:30pm with Dr. Talbert Nan Need update on patient.   Can speak with Sharee Pimple or [place] when patient returns call.

## 2019-09-02 NOTE — Telephone Encounter (Signed)
Spoke with patient. Patient states that she is still having pain that is a 7/10. Denies any chills or fever. States she has not had any nausea, but had some vomiting today. Feels this is related to her antibiotics. Patient is still on antibiotics at this time. Patient states that her bleeding has increased to changing two pads every 1.5 hours due to bleeding through. Denies feelings light headed, weak, or dizzy. Patient is still taking iron and Aygestin 5 mg daily. Advised patient will need to be seen for further evaluation. OV scheduled for 09/03/2019 at 3 pm with Dr.Jertson. Advised patient with ongoing symptoms evaluation earlier is recommended. ER precautions given to patient. Patient verbalizes understanding.

## 2019-09-03 ENCOUNTER — Ambulatory Visit: Payer: Self-pay | Admitting: Obstetrics and Gynecology

## 2019-09-03 DIAGNOSIS — E1165 Type 2 diabetes mellitus with hyperglycemia: Secondary | ICD-10-CM | POA: Diagnosis present

## 2019-09-03 DIAGNOSIS — Z833 Family history of diabetes mellitus: Secondary | ICD-10-CM | POA: Diagnosis not present

## 2019-09-03 DIAGNOSIS — N739 Female pelvic inflammatory disease, unspecified: Secondary | ICD-10-CM | POA: Diagnosis present

## 2019-09-03 DIAGNOSIS — Z79899 Other long term (current) drug therapy: Secondary | ICD-10-CM | POA: Diagnosis not present

## 2019-09-03 DIAGNOSIS — Z825 Family history of asthma and other chronic lower respiratory diseases: Secondary | ICD-10-CM | POA: Diagnosis not present

## 2019-09-03 DIAGNOSIS — Z87442 Personal history of urinary calculi: Secondary | ICD-10-CM | POA: Diagnosis not present

## 2019-09-03 DIAGNOSIS — Z7984 Long term (current) use of oral hypoglycemic drugs: Secondary | ICD-10-CM | POA: Diagnosis not present

## 2019-09-03 DIAGNOSIS — B951 Streptococcus, group B, as the cause of diseases classified elsewhere: Secondary | ICD-10-CM | POA: Diagnosis present

## 2019-09-03 DIAGNOSIS — N921 Excessive and frequent menstruation with irregular cycle: Secondary | ICD-10-CM | POA: Diagnosis not present

## 2019-09-03 DIAGNOSIS — D259 Leiomyoma of uterus, unspecified: Secondary | ICD-10-CM | POA: Diagnosis present

## 2019-09-03 DIAGNOSIS — N710 Acute inflammatory disease of uterus: Secondary | ICD-10-CM | POA: Diagnosis present

## 2019-09-03 DIAGNOSIS — D500 Iron deficiency anemia secondary to blood loss (chronic): Secondary | ICD-10-CM | POA: Diagnosis present

## 2019-09-03 DIAGNOSIS — Z9884 Bariatric surgery status: Secondary | ICD-10-CM | POA: Diagnosis not present

## 2019-09-03 DIAGNOSIS — Z20822 Contact with and (suspected) exposure to covid-19: Secondary | ICD-10-CM | POA: Diagnosis present

## 2019-09-03 DIAGNOSIS — N711 Chronic inflammatory disease of uterus: Secondary | ICD-10-CM | POA: Diagnosis present

## 2019-09-03 DIAGNOSIS — G4730 Sleep apnea, unspecified: Secondary | ICD-10-CM | POA: Diagnosis present

## 2019-09-03 DIAGNOSIS — R102 Pelvic and perineal pain: Secondary | ICD-10-CM

## 2019-09-03 DIAGNOSIS — N939 Abnormal uterine and vaginal bleeding, unspecified: Secondary | ICD-10-CM | POA: Diagnosis present

## 2019-09-03 LAB — CBC WITH DIFFERENTIAL/PLATELET
Abs Immature Granulocytes: 0.06 10*3/uL (ref 0.00–0.07)
Basophils Absolute: 0 10*3/uL (ref 0.0–0.1)
Basophils Relative: 0 %
Eosinophils Absolute: 0.6 10*3/uL — ABNORMAL HIGH (ref 0.0–0.5)
Eosinophils Relative: 6 %
HCT: 35.4 % — ABNORMAL LOW (ref 36.0–46.0)
Hemoglobin: 10.3 g/dL — ABNORMAL LOW (ref 12.0–15.0)
Immature Granulocytes: 1 %
Lymphocytes Relative: 21 %
Lymphs Abs: 2.1 10*3/uL (ref 0.7–4.0)
MCH: 19.8 pg — ABNORMAL LOW (ref 26.0–34.0)
MCHC: 29.1 g/dL — ABNORMAL LOW (ref 30.0–36.0)
MCV: 68.1 fL — ABNORMAL LOW (ref 80.0–100.0)
Monocytes Absolute: 0.8 10*3/uL (ref 0.1–1.0)
Monocytes Relative: 7 %
Neutro Abs: 6.7 10*3/uL (ref 1.7–7.7)
Neutrophils Relative %: 65 %
Platelets: 348 10*3/uL (ref 150–400)
RBC: 5.2 MIL/uL — ABNORMAL HIGH (ref 3.87–5.11)
RDW: 29.7 % — ABNORMAL HIGH (ref 11.5–15.5)
WBC: 10.2 10*3/uL (ref 4.0–10.5)
nRBC: 0 % (ref 0.0–0.2)

## 2019-09-03 LAB — RESPIRATORY PANEL BY RT PCR (FLU A&B, COVID)
Influenza A by PCR: NEGATIVE
Influenza B by PCR: NEGATIVE
SARS Coronavirus 2 by RT PCR: NEGATIVE

## 2019-09-03 LAB — FERRITIN: Ferritin: 22 ng/mL (ref 11–307)

## 2019-09-03 LAB — BASIC METABOLIC PANEL
Anion gap: 8 (ref 5–15)
BUN: 11 mg/dL (ref 6–20)
CO2: 24 mmol/L (ref 22–32)
Calcium: 8.7 mg/dL — ABNORMAL LOW (ref 8.9–10.3)
Chloride: 104 mmol/L (ref 98–111)
Creatinine, Ser: 0.61 mg/dL (ref 0.44–1.00)
GFR calc Af Amer: >60 mL/min (ref >60)
GFR calc non Af Amer: >60 mL/min (ref >60)
Glucose, Bld: 163 mg/dL — ABNORMAL HIGH (ref 70–99)
Potassium: 4.2 mmol/L (ref 3.5–5.1)
Sodium: 136 mmol/L (ref 135–145)

## 2019-09-03 LAB — CBC
HCT: 33.8 % — ABNORMAL LOW (ref 36.0–46.0)
Hemoglobin: 10 g/dL — ABNORMAL LOW (ref 12.0–15.0)
MCH: 19.8 pg — ABNORMAL LOW (ref 26.0–34.0)
MCHC: 29.6 g/dL — ABNORMAL LOW (ref 30.0–36.0)
MCV: 67.1 fL — ABNORMAL LOW (ref 80.0–100.0)
Platelets: 347 10*3/uL (ref 150–400)
RBC: 5.04 MIL/uL (ref 3.87–5.11)
RDW: 30 % — ABNORMAL HIGH (ref 11.5–15.5)
WBC: 12.3 10*3/uL — ABNORMAL HIGH (ref 4.0–10.5)
nRBC: 0 % (ref 0.0–0.2)

## 2019-09-03 MED ORDER — ACETAMINOPHEN 325 MG PO TABS: 650.0000 mg | ORAL_TABLET | ORAL | Status: DC | PRN

## 2019-09-03 MED ORDER — NORETHINDRONE ACETATE 5 MG PO TABS
5.0000 mg | ORAL_TABLET | Freq: Three times a day (TID) | ORAL | Status: DC
  Administered 2019-09-03 – 2019-09-05 (×7): 5 mg via ORAL
  Filled 2019-09-03 (×9): qty 1

## 2019-09-03 MED ORDER — KETOROLAC TROMETHAMINE 30 MG/ML IJ SOLN
30.0000 mg | Freq: Four times a day (QID) | INTRAMUSCULAR | Status: AC | PRN
  Administered 2019-09-04 – 2019-09-05 (×3): 30 mg via INTRAVENOUS
  Filled 2019-09-03 (×3): qty 1

## 2019-09-03 MED ORDER — SODIUM CHLORIDE 0.9 % IV SOLN
2.0000 g | Freq: Four times a day (QID) | INTRAVENOUS | Status: DC
  Administered 2019-09-03 – 2019-09-05 (×9): 2 g via INTRAVENOUS
  Filled 2019-09-03 (×4): qty 2
  Filled 2019-09-03: qty 2000
  Filled 2019-09-03 (×2): qty 2
  Filled 2019-09-03 (×2): qty 2000
  Filled 2019-09-03: qty 2
  Filled 2019-09-03: qty 2000

## 2019-09-03 MED ORDER — ONDANSETRON HCL 4 MG PO TABS: 4.0000 mg | ORAL_TABLET | Freq: Four times a day (QID) | ORAL | Status: DC | PRN

## 2019-09-03 MED ORDER — CLINDAMYCIN PHOSPHATE 900 MG/50ML IV SOLN
900.0000 mg | Freq: Three times a day (TID) | INTRAVENOUS | Status: DC
  Administered 2019-09-03 – 2019-09-05 (×7): 900 mg via INTRAVENOUS
  Filled 2019-09-03 (×8): qty 50

## 2019-09-03 MED ORDER — KETOROLAC TROMETHAMINE 30 MG/ML IJ SOLN: 30.0000 mg | Freq: Four times a day (QID) | INTRAMUSCULAR | Status: DC

## 2019-09-03 MED ORDER — SODIUM CHLORIDE 0.9 % IV SOLN
1000.0000 mg | Freq: Once | INTRAVENOUS | Status: AC
  Administered 2019-09-03: 23:00:00 1000 mg via INTRAVENOUS
  Filled 2019-09-03: qty 20

## 2019-09-03 MED ORDER — SENNOSIDES-DOCUSATE SODIUM 8.6-50 MG PO TABS
2.0000 | ORAL_TABLET | Freq: Every day | ORAL | Status: DC
  Administered 2019-09-03 – 2019-09-04 (×2): 2 via ORAL
  Filled 2019-09-03 (×2): qty 2

## 2019-09-03 MED ORDER — METFORMIN HCL ER 500 MG PO TB24
500.0000 mg | ORAL_TABLET | Freq: Every day | ORAL | Status: DC
  Administered 2019-09-03 – 2019-09-05 (×3): 500 mg via ORAL
  Filled 2019-09-03 (×6): qty 1

## 2019-09-03 MED ORDER — ONDANSETRON HCL 4 MG/2ML IJ SOLN: 4.0000 mg | Freq: Four times a day (QID) | INTRAMUSCULAR | Status: DC | PRN

## 2019-09-03 MED ORDER — GENTAMICIN SULFATE 40 MG/ML IJ SOLN
400.0000 mg | INTRAVENOUS | Status: DC
  Administered 2019-09-04 – 2019-09-05 (×2): 400 mg via INTRAVENOUS
  Filled 2019-09-03 (×2): qty 10

## 2019-09-03 MED ORDER — KETOROLAC TROMETHAMINE 30 MG/ML IJ SOLN
30.0000 mg | Freq: Four times a day (QID) | INTRAMUSCULAR | Status: DC
  Administered 2019-09-03 (×2): 30 mg via INTRAVENOUS
  Filled 2019-09-03 (×2): qty 1

## 2019-09-03 MED ORDER — SODIUM CHLORIDE 0.9 % IV SOLN
25.0000 mg | Freq: Once | INTRAVENOUS | Status: AC
  Administered 2019-09-03: 25 mg via INTRAVENOUS
  Filled 2019-09-03: qty 0.5

## 2019-09-03 MED ORDER — GENTAMICIN SULFATE 40 MG/ML IJ SOLN
540.0000 mg | INTRAVENOUS | Status: DC
  Administered 2019-09-03: 540 mg via INTRAVENOUS
  Filled 2019-09-03: qty 13.5

## 2019-09-03 MED ORDER — SIMETHICONE 80 MG PO CHEW
80.0000 mg | CHEWABLE_TABLET | Freq: Four times a day (QID) | ORAL | Status: DC | PRN
  Filled 2019-09-03: qty 1

## 2019-09-03 MED ORDER — SODIUM CHLORIDE 0.45 % IV SOLN: INTRAVENOUS | Status: DC

## 2019-09-03 MED ORDER — MENTHOL 3 MG MT LOZG
1.0000 | LOZENGE | OROMUCOSAL | Status: DC | PRN
  Filled 2019-09-03: qty 9

## 2019-09-03 NOTE — Telephone Encounter (Signed)
Per review of Epic, patient currently at [facility] hospital for evaluation.   OV cancelled for today at 3 pm with Dr. Talbert Nan.   Routing to Dr. Talbert Nan.

## 2019-09-03 NOTE — Progress Notes (Signed)
Report received from [place] RN

## 2019-09-03 NOTE — Progress Notes (Signed)
Test dose of INFED given at 2037. No adverse reactions noted, NAD noted. Pt states that she has an burst of energy. Will call pharmacy to obtain next dose.

## 2019-09-03 NOTE — Progress Notes (Addendum)
Subjective: Patient reports that her pain is down to a 6/10 in severity with toradol, this is tolerable to her. She doesn't want narcotics. She is eating and voiding fine.     Objective: I have reviewed patient's vital signs, medications and labs.  Today's Vitals   09/03/19 0315 09/03/19 0330 09/03/19 0613 09/03/19 0630  BP:  (!) 121/59 134/88 (!) 149/106  Pulse: 83 77 73 78  Resp: 20 (!) 21 12 (!) 21  Temp:      TempSrc:      SpO2: 97% 100% 100% 100%     General: alert, cooperative and no distress Resp: clear to auscultation bilaterally Cardio: S1, S2 normal GI: soft, 16 week sized uterus in her lower abdomen. Uterus is firm and very tender, some voluntary guarding, no rebound Extremities: extremities normal, atraumatic, no cyanosis or edema Vaginal Bleeding: moderate   CBC Latest Ref Rng & Units 09/03/2019 09/02/2019 08/22/2019  WBC 4.0 - 10.5 K/uL 12.3(H) 14.2(H) 10.3  Hemoglobin 12.0 - 15.0 g/dL 10.0(L) 10.1(L) 8.7(L)  Hematocrit 36.0 - 46.0 % 33.8(L) 34.5(L) 30.7(L)  Platelets 150 - 400 K/uL 347 380 471(H)   Labs from 08/22/19 negative GC/CT/Trich (see above CBC), pap unsatisfactory secondary to inflamation.  Ferritin 19    Assessment/Plan: 1) 44 yo G0 female with an enlarged fibroid uterus, pelvic infection (failed outpatient treatment). GBS grew on endometrial culture  -She was started on amp/gent/clinda during the night  -CT without signs of abscess  2) AUB recent biopsy with acute and chronic endometritis  -on aygestin, was taking 1 po qd, increased to tid on admission. Currently Hgb is stable, bleeding appears moderate  3)H/O anemia, she has been on oral iron. Hgb increased from 8.7 12 days ago to 10 now (stable from admission in the middle of the night.  Will check CBC and ferritin this afternoon.   4)AODM on metformin  5)Pain, will control with toradol and tylenol, patient declines narcotics.     LOS: 0 days    Salvadore Dom 09/03/2019, 9:36  AM   Addendum: I spoke with the pharmacist, Doreene Eland. Per his recommendation the Toradol was switched to prn

## 2019-09-03 NOTE — Progress Notes (Signed)
Evening rounds: S: feeling a little better, pain is better controlled. She continues to bleed, states at times is heavy. Feels exhausted, wants to do the IV iron that was previously discussed.   Today's Vitals   09/03/19 1005 09/03/19 1037 09/03/19 1100 09/03/19 1324  BP:  123/66 108/66 118/75  Pulse: 79 79 78 78  Resp: 19 18 17 18   Temp:    97.7 F (36.5 C)  TempSrc:    Oral  SpO2: 100% 100% 99% 100%    O:Abd: soft, uterus is palpated in her lower abdomen, tender, but less tender than earlier today Vag pad: moderate blood  CBC EXTENDED Latest Ref Rng & Units 09/03/2019 09/03/2019 09/02/2019  WBC 4.0 - 10.5 K/uL 10.2 12.3(H) 14.2(H)  RBC 3.87 - 5.11 MIL/uL 5.20(H) 5.04 5.05  HGB 12.0 - 15.0 g/dL 10.3(L) 10.0(L) 10.1(L)  HCT 36.0 - 46.0 % 35.4(L) 33.8(L) 34.5(L)  PLT 150 - 400 K/uL 348 347 380  NEUTROABS 1.7 - 7.7 K/uL 6.7 - -  LYMPHSABS 0.7 - 4.0 K/uL 2.1 - -   Ferritin: 22  A/P 1)Hospital day #1 pid, failed outpatient management  -continue amp/gent/clinda 2)Stable anemia, low iron. The patient c/o exhaustion, doesn't want to wait the weeks it will take to correct her anemia with oral iron. Continues to bleed, at the moment her bleeding is moderate and her hgb is stable. We discussed the risk of transfusion reaction. She wants to proceed.

## 2019-09-03 NOTE — ED Notes (Signed)
Pt ambulates back and forth to the bathroom

## 2019-09-03 NOTE — ED Notes (Signed)
Pt given sprite, graham crackers and peanut butter per request

## 2019-09-03 NOTE — Progress Notes (Signed)
Pharmacy Antibiotic Note  Cassandra Kim is a 44 y.o. female admitted on 09/02/2019 with PID.  Pharmacy has been consulted for Gentamicin dosing.  Plan:  Can adjust gentamicin to (5 mg/kg ABW) given indication  Consider checking 10-hr level and adjusting per Cassandra Kim & Cassandra Kim nomogram if to continue gent > 48 hr  Continue ampicillin and clindamycin per MD; dosing appropriate     Temp (24hrs), Avg:97.9 F (36.6 C), Min:97.7 F (36.5 C), Max:98.1 F (36.7 C)  Recent Labs  Lab 09/02/19 2000 09/03/19 0605  WBC 14.2* 12.3*  CREATININE 0.53 0.61    Estimated Creatinine Clearance: 110.2 mL/min (by C-G formula based on SCr of 0.61 mg/dL).    No Known Allergies  Antimicrobials this admission: Gentamicin 09/03/2019 >>  Dose adjustments this admission: -  Microbiology results: - 4/22 endometrial tissue Cx growing group B strep  Thank you for allowing pharmacy to be a part of this patient's care.  Cassandra Kim A 09/03/2019 2:55 PM

## 2019-09-03 NOTE — Progress Notes (Signed)
Pharmacy Antibiotic Note  Cassandra Kim is a 44 y.o. female admitted on 09/02/2019 with PID.  Pharmacy has been consulted for Gentamicin dosing.  Plan: Gentamicin 540mg  (7mg /kg ABW) iv q24hr Will check 10hr per [place] nomogram, if needed.     Temp (24hrs), Avg:98.1 F (36.7 C), Min:98.1 F (36.7 C), Max:98.1 F (36.7 C)  Recent Labs  Lab 09/02/19 2000  WBC 14.2*  CREATININE 0.53    Estimated Creatinine Clearance: 110.2 mL/min (by C-G formula based on SCr of 0.53 mg/dL).    No Known Allergies  Antimicrobials this admission: Gentamicin 09/03/2019 >>  Dose adjustments this admission: -  Microbiology results: -  Thank you for allowing pharmacy to be a part of this patient's care.  Nani Skillern Crowford 09/03/2019 12:26 AM

## 2019-09-04 ENCOUNTER — Telehealth: Payer: Self-pay | Admitting: Obstetrics and Gynecology

## 2019-09-04 ENCOUNTER — Telehealth: Payer: Self-pay | Admitting: *Deleted

## 2019-09-04 ENCOUNTER — Telehealth: Payer: Self-pay | Admitting: Nurse Practitioner

## 2019-09-04 DIAGNOSIS — D500 Iron deficiency anemia secondary to blood loss (chronic): Secondary | ICD-10-CM | POA: Diagnosis not present

## 2019-09-04 DIAGNOSIS — N739 Female pelvic inflammatory disease, unspecified: Secondary | ICD-10-CM | POA: Diagnosis not present

## 2019-09-04 DIAGNOSIS — N921 Excessive and frequent menstruation with irregular cycle: Secondary | ICD-10-CM | POA: Diagnosis not present

## 2019-09-04 DIAGNOSIS — R102 Pelvic and perineal pain: Secondary | ICD-10-CM | POA: Diagnosis not present

## 2019-09-04 LAB — BASIC METABOLIC PANEL
Anion gap: 7 (ref 5–15)
BUN: 11 mg/dL (ref 6–20)
CO2: 26 mmol/L (ref 22–32)
Calcium: 8.8 mg/dL — ABNORMAL LOW (ref 8.9–10.3)
Chloride: 105 mmol/L (ref 98–111)
Creatinine, Ser: 0.57 mg/dL (ref 0.44–1.00)
GFR calc Af Amer: >60 mL/min (ref >60)
GFR calc non Af Amer: >60 mL/min (ref >60)
Glucose, Bld: 130 mg/dL — ABNORMAL HIGH (ref 70–99)
Potassium: 4.2 mmol/L (ref 3.5–5.1)
Sodium: 138 mmol/L (ref 135–145)

## 2019-09-04 LAB — CBC
HCT: 32.6 % — ABNORMAL LOW (ref 36.0–46.0)
Hemoglobin: 9.6 g/dL — ABNORMAL LOW (ref 12.0–15.0)
MCH: 19.9 pg — ABNORMAL LOW (ref 26.0–34.0)
MCHC: 29.4 g/dL — ABNORMAL LOW (ref 30.0–36.0)
MCV: 67.5 fL — ABNORMAL LOW (ref 80.0–100.0)
Platelets: 330 10*3/uL (ref 150–400)
RBC: 4.83 MIL/uL (ref 3.87–5.11)
RDW: 29.3 % — ABNORMAL HIGH (ref 11.5–15.5)
WBC: 10 10*3/uL (ref 4.0–10.5)
nRBC: 0 % (ref 0.0–0.2)

## 2019-09-04 LAB — GC/CHLAMYDIA PROBE AMP (~~LOC~~) NOT AT ARMC
Chlamydia: NEGATIVE
Comment: NEGATIVE
Comment: NORMAL
Neisseria Gonorrhea: NEGATIVE

## 2019-09-04 NOTE — Telephone Encounter (Signed)
Received a new hem referral from Dr. Talbert Nan for IDA. Pt has been scheduled to see Cassandra Kim on 5/19 at 1:45pm. Cassandra Kim is currently in the hospital. I sent a msg to the referring office to notify the pt of the appt date and time.

## 2019-09-04 NOTE — Telephone Encounter (Signed)
Left voicemail regarding referral appointment. The information is listed below. Should the patient need to cancel or reschedule this appointment, Please advise them to call the office they've been referred to in order to reschedule.  [facility] [place],  [place], [place] [address] [phone]  Cira Rue, [place].  09/18/19 @ 1:45pm. Please arrive 15 minutes early and bring your insurance card and photo id and list of medications.

## 2019-09-04 NOTE — Telephone Encounter (Signed)
Call to patient for update per Dr. Talbert Nan, line busy.

## 2019-09-04 NOTE — Telephone Encounter (Signed)
Call placed to patient x2 at [phone], line busy.

## 2019-09-04 NOTE — Telephone Encounter (Signed)
Spoke with patient. Patient states she is feeling better, no new symptoms. Is getting ready to get up and try to take a walk with the CNA. Reports she is still bleeding, changing 1-2 pads q45 minutes. Only required ibuprofen last night. Advised patient I will provide update to Dr. Talbert Nan, she will round on 5/6. Patient thankful for call.   Call to Dr. Talbert Nan to provide update.   Routing to provider for final review. Patient is agreeable to disposition. Will close encounter.

## 2019-09-04 NOTE — Progress Notes (Signed)
Subjective: Patient reports that she is feeling better, her pain is improving, lower belly is less tender. Still bleeding heavily at times. Just changed her pad 15 minutes ago, bleed onto the chucks 2 x last night. Vaginal odor is gone.   Objective: I have reviewed patient's vital signs, medications and labs.  CBC Latest Ref Rng & Units 09/04/2019 09/03/2019 09/03/2019  WBC 4.0 - 10.5 K/uL 10.0 10.2 12.3(H)  Hemoglobin 12.0 - 15.0 g/dL 9.6(L) 10.3(L) 10.0(L)  Hematocrit 36.0 - 46.0 % 32.6(L) 35.4(L) 33.8(L)  Platelets 150 - 400 K/uL 330 348 347   Lab Results  Component Value Date   NA 138 09/04/2019   K 4.2 09/04/2019   CO2 26 09/04/2019   GLUCOSE 130 (H) 09/04/2019   BUN 11 09/04/2019   CREATININE 0.57 09/04/2019   CALCIUM 8.8 (L) 09/04/2019   GFRNONAA >60 09/04/2019   GFRAA >60 09/04/2019    Today's Vitals   09/03/19 1700 09/03/19 2130 09/04/19 0124 09/04/19 0616  BP: 140/80 (!) 165/88 134/82 132/76  Pulse: 69 72 66 63  Resp: 18 17 17 17   Temp: 99.8 F (37.7 C) 98.8 F (37.1 C) 98.7 F (37.1 C) 98.3 F (36.8 C)  TempSrc: Oral  Oral Oral  SpO2: 99% 100% 99% 97%    General: alert, cooperative and no distress Resp: clear to auscultation bilaterally Cardio: S1, S2 normal GI: soft, minimally tender in her lower abdomen (huge improvement), uterus palpated in her lower abdomen. Extremities: extremities normal, atraumatic, no cyanosis or edema Vaginal Bleeding: minimal blood on the pad, just changed 15 minutes ago   Assessment/Plan: 1)Pelvic infection, improving on amp/gent/clinda  -if she continues to improve, hope to d/c to home tomorrow on oral antibiotics 2)AUB, recent biopsy with acute and chronic endometritis  -Increased from aygestin 5 mg po qd to 5 mg tid on admission 3)H/O anemia, slight decrease in hgb since admission  -s/p iron transfusion last night  -Continue aygestin  -recheck CBC and ferritin tomorrow 4)Fibroid uterus, symptomatic, will plan hysterectomy  after infection clears  5)Pain, currently controlled.   LOS: 1 day    Salvadore Dom 09/04/2019, 8:00 AM

## 2019-09-05 ENCOUNTER — Telehealth: Payer: Self-pay | Admitting: *Deleted

## 2019-09-05 DIAGNOSIS — N739 Female pelvic inflammatory disease, unspecified: Secondary | ICD-10-CM | POA: Diagnosis not present

## 2019-09-05 DIAGNOSIS — N921 Excessive and frequent menstruation with irregular cycle: Secondary | ICD-10-CM | POA: Diagnosis not present

## 2019-09-05 DIAGNOSIS — D500 Iron deficiency anemia secondary to blood loss (chronic): Secondary | ICD-10-CM | POA: Diagnosis not present

## 2019-09-05 DIAGNOSIS — R102 Pelvic and perineal pain: Secondary | ICD-10-CM | POA: Diagnosis not present

## 2019-09-05 LAB — BASIC METABOLIC PANEL
Anion gap: 10 (ref 5–15)
BUN: 10 mg/dL (ref 6–20)
CO2: 25 mmol/L (ref 22–32)
Calcium: 9.1 mg/dL (ref 8.9–10.3)
Chloride: 102 mmol/L (ref 98–111)
Creatinine, Ser: 0.52 mg/dL (ref 0.44–1.00)
GFR calc Af Amer: >60 mL/min (ref >60)
GFR calc non Af Amer: >60 mL/min (ref >60)
Glucose, Bld: 203 mg/dL — ABNORMAL HIGH (ref 70–99)
Potassium: 4 mmol/L (ref 3.5–5.1)
Sodium: 137 mmol/L (ref 135–145)

## 2019-09-05 LAB — CBC
HCT: 33.3 % — ABNORMAL LOW (ref 36.0–46.0)
Hemoglobin: 10 g/dL — ABNORMAL LOW (ref 12.0–15.0)
MCH: 20.1 pg — ABNORMAL LOW (ref 26.0–34.0)
MCHC: 30 g/dL (ref 30.0–36.0)
MCV: 67 fL — ABNORMAL LOW (ref 80.0–100.0)
Platelets: 329 10*3/uL (ref 150–400)
RBC: 4.97 MIL/uL (ref 3.87–5.11)
RDW: 29.5 % — ABNORMAL HIGH (ref 11.5–15.5)
WBC: 9.8 10*3/uL (ref 4.0–10.5)
nRBC: 0 % (ref 0.0–0.2)

## 2019-09-05 LAB — FERRITIN: Ferritin: 124 ng/mL (ref 11–307)

## 2019-09-05 MED ORDER — NORETHINDRONE ACETATE 5 MG PO TABS
5.0000 mg | ORAL_TABLET | Freq: Three times a day (TID) | ORAL | 0 refills | Status: DC
Start: 2019-09-05 — End: 2019-10-17

## 2019-09-05 MED ORDER — AMOXICILLIN-POT CLAVULANATE 875-125 MG PO TABS: 1.0000 | ORAL_TABLET | Freq: Three times a day (TID) | ORAL | 0 refills | Status: AC

## 2019-09-05 MED ORDER — POLYSACCHARIDE IRON COMPLEX 150 MG PO CAPS: 150.0000 mg | ORAL_CAPSULE | Freq: Every day | ORAL | 1 refills | Status: DC

## 2019-09-05 MED ORDER — IBUPROFEN 800 MG PO TABS: 800.0000 mg | ORAL_TABLET | Freq: Three times a day (TID) | ORAL | 0 refills | Status: DC | PRN

## 2019-09-05 NOTE — Discharge Summary (Signed)
Physician Discharge Summary  Patient ID: Cassandra Kim MRN: [id number] DOB/AGE: May 01, 1976 44 y.o.  Admit date: 09/02/2019 Discharge date: 09/05/2019  Admission Diagnoses:  Discharge Diagnoses:  Active Problems:   Pelvic inflammatory disease   Pelvic infection in female   Discharged Condition: good  Hospital Course: Uncomplicated  Consults: None  Significant Diagnostic Studies: labs: CBC and radiology: CT with fibroid uterus, no evidence of PID  Treatments: antibiotics: ampicillin, gentamicin, clindamycin  Today's Vitals   09/04/19 2136 09/04/19 2206 09/04/19 2227 09/05/19 0546  BP:   129/69 119/61  Pulse:   70 65  Resp:   16 17  Temp:   99 F (37.2 C) 98.6 F (37 C)  TempSrc:   Oral Oral  SpO2:   99% 97%  PainSc: 6  4      There is no height or weight on file to calculate BMI. CBC Latest Ref Rng & Units 09/05/2019 09/04/2019 09/03/2019  WBC 4.0 - 10.5 K/uL 9.8 10.0 10.2  Hemoglobin 12.0 - 15.0 g/dL 10.0(L) 9.6(L) 10.3(L)  Hematocrit 36.0 - 46.0 % 33.3(L) 32.6(L) 35.4(L)  Platelets 150 - 400 K/uL 329 330 348     Discharge Exam: Blood pressure 119/61, pulse 65, temperature 98.6 F (37 C), temperature source Oral, resp. rate 17, SpO2 97 %. General appearance: alert, cooperative and no distress Resp: clear to auscultation bilaterally Cardio: regular rate and rhythm GI: soft, not tender, uterus palpated in her lower uterus Pelvic: vag pad without blood (just changed) Extremities: extremities normal, atraumatic, no cyanosis or edema  Disposition: Discharge disposition: 01-Home or Self Care       Discharge Instructions    Activity as tolerated - No restrictions   Complete by: As directed    Call MD for:   Complete by: As directed    Call for heavy vaginal bleeding, fever, worsening pain   Call MD for:  persistant nausea and vomiting   Complete by: As directed    Call MD for:  severe uncontrolled pain   Complete by: As directed    Call MD for:  temperature  >100.4   Complete by: As directed    Diet - low sodium heart healthy   Complete by: As directed      Will d/c to home on augmentin F/U next week Will plan on starting her on [place] and plan hysterectomy in July.    Signed: Salvadore Dom 09/05/2019, 10:05 AM

## 2019-09-05 NOTE — Discharge Instructions (Signed)
Endometritis  Endometritis is irritation, soreness, or inflammation that affects the lining of the uterus (endometrium). Infection is usually the cause of endometritis. It is important to get treatment to prevent complications. Common complications may include more severe infections and not being able to have children(infertility). What are the causes? This condition may be caused by:  Bacterial infections.  STIs (sexually transmitted infections).  A miscarriage or childbirth, especially after a long labor or cesarean delivery.  Certain gynecological procedures. These may include dilation and curettage (D&C), hysteroscopy, or birth control (contraceptive) insertion.  Tuberculosis (TB). What are the signs or symptoms? Symptoms of this condition include:  Fever.  Lower abdomen (abdominal) pain.  Pelvis (pelvic) pain.  Abnormal vaginal discharge or bleeding.  Abdominal bloating (distention) or swelling.  General discomfort or generally feeling ill.  Discomfort with bowel movements.  Constipation. How is this diagnosed? This condition may be diagnosed based on:  A physical exam, including a pelvic exam.  Tests, such as: ? Blood tests. ? Removal of a sample of endometrial tissue for testing (endometrial biopsy). ? Examining a sample of vaginal discharge under a microscope (wet prep). ? Removal of a sample of fluid from the cervix for testing (cervical culture). ? Surgical examination of the pelvis and abdomen. How is this treated? This condition is treated with:  Antibiotic medicines.  For more severe cases, hospitalization may be needed to give fluids and antibiotics directly into a vein through an IV tube. Follow these instructions at home:  Take over-the-counter and prescription medicines only as told by your health care provider.  Drink enough fluid to keep your urine clear or pale yellow.  Take your antibiotic medicine as told by your health care provider. Do  not stop taking the antibiotic even if you start to feel better.  Do not douche or have sex (including vaginal, oral, and anal sex) until your health care provider approves.  If your endometritis was caused by an STI, do not have sex (including vaginal, oral, and anal sex) until your partner has also been treated for the STI.  Return to your normal activities as told by your health care provider. Ask your health care provider what activities are safe for you.  Keep all follow-up visits as told by your health care provider. This is important. Contact a health care provider if:  You have pain that does not get better with medicine.  You have a fever.  You have pain with bowel movements. Get help right away if:  You have abdominal swelling.  You have abdominal pain that gets worse.  You have bad-smelling vaginal discharge, or an increased amount of vaginal discharge.  You have abnormal vaginal bleeding.  You have nausea and vomiting. Summary  Endometritis affects the lining of the uterus (endometrium) and is usually caused by an infection.  It is important to get treatment to prevent complications.  You have several treatment options for endometritis. Treatment may include antibiotics and IV fluids.  Take your antibiotic medicine as told by your health care provider. Do not stop taking the antibiotic even if you start to feel better.  Do not douche or have sex (including vaginal, oral, and anal sex) until your health care provider approves. This information is not intended to replace advice given to you by your health care provider. Make sure you discuss any questions you have with your health care provider. Document Revised: 03/31/2017 Document Reviewed: 05/03/2016 Elsevier Patient Education  2020 Elsevier Inc.  

## 2019-09-05 NOTE — Telephone Encounter (Signed)
Oriahnn complete enrollment and prescription form completed and faxed to [facility] complete at [phone].

## 2019-09-05 NOTE — Telephone Encounter (Signed)
Her bleeding hasn't changed and her hgb is stable.

## 2019-09-05 NOTE — Telephone Encounter (Signed)
Patient in office.   Return to work letter given to patient.  1 wk f/u OV scheduled for 5/13 at 4:30pm.   Barbette Merino Rx reviewed with patient and Dr. Talbert Nan. Patient will continue Aygestin until she receives [employer].   Rx for Aygestin 5 mg tab PO tid. #30/0RF to verified pharmacy.   Patient states the best contact number for her is ([phone]  Patient verbalizes understanding and is agreeable.

## 2019-09-05 NOTE — Telephone Encounter (Signed)
Letter pended for return to work on 09/09/19.  No heavy lifting greater than 10 lbs.  Patient will pick up today.

## 2019-09-11 NOTE — Telephone Encounter (Signed)
Left message to call Charnae Lill, RN at [facility] [phone].   

## 2019-09-12 ENCOUNTER — Ambulatory Visit (INDEPENDENT_AMBULATORY_CARE_PROVIDER_SITE_OTHER): Payer: BC Managed Care – PPO | Admitting: Obstetrics and Gynecology

## 2019-09-12 ENCOUNTER — Other Ambulatory Visit: Payer: Self-pay

## 2019-09-12 ENCOUNTER — Encounter: Payer: Self-pay | Admitting: Obstetrics and Gynecology

## 2019-09-12 ENCOUNTER — Other Ambulatory Visit (HOSPITAL_COMMUNITY)
Admission: RE | Admit: 2019-09-12 | Discharge: 2019-09-12 | Disposition: A | Discharge status: Home / Self Care | Payer: BC Managed Care – PPO | Source: Ambulatory Visit | Attending: Obstetrics and Gynecology | Admitting: Obstetrics and Gynecology

## 2019-09-12 VITALS — BP 122/72 | HR 77 | Temp 97.2°F | Ht 64.0 in | Wt 241.0 lb

## 2019-09-12 DIAGNOSIS — Z862 Personal history of diseases of the blood and blood-forming organs and certain disorders involving the immune mechanism: Secondary | ICD-10-CM | POA: Diagnosis not present

## 2019-09-12 DIAGNOSIS — D219 Benign neoplasm of connective and other soft tissue, unspecified: Secondary | ICD-10-CM | POA: Diagnosis not present

## 2019-09-12 DIAGNOSIS — Z124 Encounter for screening for malignant neoplasm of cervix: Secondary | ICD-10-CM | POA: Insufficient documentation

## 2019-09-12 DIAGNOSIS — Z8742 Personal history of other diseases of the female genital tract: Secondary | ICD-10-CM

## 2019-09-12 DIAGNOSIS — N939 Abnormal uterine and vaginal bleeding, unspecified: Secondary | ICD-10-CM | POA: Diagnosis not present

## 2019-09-12 NOTE — Progress Notes (Signed)
GYNECOLOGY  VISIT   HPI: 44 y.o.   Single Black or African American Not Hispanic or Latino  female   [id number] with No LMP recorded. (Menstrual status: Irregular Periods).   here for 1 week f/u. She was hospitalized for PID, released last week. Her pain is much better than it was, but not gone. Pain down to a 3/10, prior to admission it was a 9/10 in severity.  She is still taking the Augmentin. Her vaginal odor is better. Still bleeding, not as heavy, but still saturating a pad in an hour at a times (down from 30 minutes).   GYNECOLOGIC HISTORY: No LMP recorded. (Menstrual status: Irregular Periods). Contraception:abstinence Menopausal hormone therapy: none        OB History    Gravida  0   Para  0   Term  0   Preterm  0   AB  0   Living  0     SAB  0   TAB  0   Ectopic  0   Multiple  0   Live Births  0              Patient Active Problem List   Diagnosis Date Noted  . Pelvic inflammatory disease 09/03/2019  . Pelvic infection in female 09/03/2019  . Paresthesias 07/14/2019  . Abnormal uterine bleeding 07/06/2019  . Iron deficiency anemia due to chronic blood loss 07/06/2019  . Type II diabetes mellitus ([place]) 07/03/2019  . Sleep apnea 05/02/2013    Past Medical History:  Diagnosis Date  . Abnormal uterine bleeding   . Anemia   . Diabetes mellitus without complication ([place])   . Fibroid   . Obesity     Past Surgical History:  Procedure Laterality Date  . BREAST REDUCTION SURGERY Bilateral   . HYSTEROSCOPY WITH SALPINGOGRAM    . KIDNEY STONE SURGERY    . LAPAROSCOPIC GASTRIC SLEEVE RESECTION    . PANNICULECTOMY      Current Outpatient Medications  Medication Sig Dispense Refill  . acetaminophen (TYLENOL) 500 MG tablet Take 500 mg by mouth every 6 (six) hours as needed for moderate pain.    Marland Kitchen amoxicillin-clavulanate (AUGMENTIN) 875-125 MG tablet Take 1 tablet by mouth 3 (three) times daily for 10 days. 30 tablet 0  . ibuprofen (ADVIL) 800 MG  tablet Take 1 tablet (800 mg total) by mouth every 8 (eight) hours as needed. 30 tablet 0  . iron polysaccharides (FERREX 150) 150 MG capsule Take 1 capsule (150 mg total) by mouth daily. 30 capsule 1  . metFORMIN (GLUCOPHAGE-XR) 500 MG 24 hr tablet Take 1 tablet (500 mg total) by mouth daily with breakfast. 180 tablet 3  . norethindrone (AYGESTIN) 5 MG tablet Take 1 tablet (5 mg total) by mouth 3 (three) times daily. 30 tablet 0   No current facility-administered medications for this visit.     ALLERGIES: Patient has no known allergies.  Family History  Problem Relation Age of Onset  . Asthma Mother   . Diabetes Mother   . Cancer Sister     Social History   Socioeconomic History  . Marital status: Single    Spouse name: Not on file  . Number of children: Not on file  . Years of education: Not on file  . Highest education level: Not on file  Occupational History  . Not on file  Tobacco Use  . Smoking status: Never Smoker  . Smokeless tobacco: Never Used  Substance and Sexual Activity  .  Alcohol use: Not Currently  . Drug use: Never  . Sexual activity: Not Currently    Partners: Female    Birth control/protection: Abstinence    Comment: last SA x9 years ago per patient  Other Topics Concern  . Not on file  Social History Narrative  . Not on file   Social Determinants of Health   Financial Resource Strain:   . Difficulty of Paying Living Expenses:   Food Insecurity:   . Worried About Charity fundraiser in the Last Year:   . Arboriculturist in the Last Year:   Transportation Needs:   . Film/video editor (Medical):   Marland Kitchen Lack of Transportation (Non-Medical):   Physical Activity:   . Days of Exercise per Week:   . Minutes of Exercise per Session:   Stress:   . Feeling of Stress :   Social Connections:   . Frequency of Communication with Friends and Family:   . Frequency of Social Gatherings with Friends and Family:   . Attends Religious Services:   . Active  Member of Clubs or Organizations:   . Attends Archivist Meetings:   Marland Kitchen Marital Status:   Intimate Partner Violence:   . Fear of Current or Ex-Partner:   . Emotionally Abused:   Marland Kitchen Physically Abused:   . Sexually Abused:     Review of Systems  Constitutional: Negative.   HENT: Negative.   Eyes: Negative.   Respiratory: Negative.   Cardiovascular: Negative.   Gastrointestinal: Negative.   Genitourinary: Negative.   Musculoskeletal: Negative.   Skin: Negative.   Neurological: Negative.   Endo/Heme/Allergies: Negative.   Psychiatric/Behavioral: Negative.     PHYSICAL EXAMINATION:    BP 122/72 (BP Location: Right Arm, Patient Position: Sitting, Cuff Size: Normal)   Pulse 77   Temp (!) 97.2 F (36.2 C) (Temporal)   Ht 5\' 4"  (1.626 m)   Wt 241 lb (109.3 kg)   SpO2 99%   BMI 41.37 kg/m     General appearance: alert, cooperative and appears stated age Abdomen: soft, tender in her lower abdomen (but not as tender as it was).   Pelvic: External genitalia:  no lesions              Urethra:  normal appearing urethra with no masses, tenderness or lesions              Bartholins and Skenes: normal                 Vagina: normal appearing vagina with normal color and discharge, no lesions, minimal blood in the vagina.              Cervix: no lesions              Bimanual Exam:  Uterus:  16 week sized, tender (less than prior to admission)              Adnexa: no mass, fullness, tenderness                Chaperone was present for exam.  ASSESSMENT PID, s/p inpatient treatment, doing much better. Still on Augmentin Symptomatic fibroid uterus AUB leading to anemia. Feeling much better after iron transfusion Pap from last month wasn't satisfactory secondary to inflammation  PLAN Finish Augmentin Repeat pap Increase Aygestin to TID (she has been taking one a day) Awaiting approval on Oriahnn, if she can afford to be on this until her surgery that would be  ideal. Will  plan TLH/BS/Cystoscopy at the end of September or early October.    Over 20 minutes in total patient care.

## 2019-09-13 ENCOUNTER — Other Ambulatory Visit: Payer: Self-pay | Admitting: Obstetrics and Gynecology

## 2019-09-13 NOTE — Telephone Encounter (Signed)
Call placed to [facility] for status of Oriahnn.  Spoke with CSR Hassan Rowan. Confirmed enrollment form and RX received on 09/05/19. Was advised patient has not been assigned an Emergency planning/management officer, she will put in an expedited request. Ambassador should contact patient within 24 hours.

## 2019-09-13 NOTE — Telephone Encounter (Signed)
Medication refill request: Aygestin 5mg  #30, 1R Last AEX:  Last office visit 08-29-19 Next AEX: none Last MMG (if hormonal medication request):Never Refill authorized: please fill if appropriate

## 2019-09-13 NOTE — Telephone Encounter (Signed)
Left message to call Jill, RN at [facility] [phone].   

## 2019-09-16 LAB — CYTOLOGY - PAP
Comment: NEGATIVE
Diagnosis: NEGATIVE
High risk HPV: NEGATIVE

## 2019-09-16 NOTE — Telephone Encounter (Signed)
The patient was given a script for aygestin 5 mg tid when she left the hospital. The one a day Aygestin script was from prior to the hospitalization. Is this request coming from the pharmacy? I think this is an error.

## 2019-09-16 NOTE — Telephone Encounter (Signed)
Spoke with Melanie at [facility]. Was advised Rx refill request for Aygestin 5 mg tab PO daily sent in error. Confirmed patient picked up RX for Aygestin 5mg  PO tid #30 tabs on 09/05/19.   Refill denied.   Routing to Dr. Rosann Auerbach.   Encounter closed.

## 2019-09-16 NOTE — Telephone Encounter (Signed)
Call to patient, left detailed message. Advised calling to f/u on Oriahnn. Advised patient if she has not received a call from [facility], she may contact them directly at [phone] and request to speak with a Nurse Ambassador, they are available 7am -7pm. Return call to Sharee Pimple, RN at [facility] at [phone] to provide update.

## 2019-09-17 ENCOUNTER — Telehealth: Payer: Self-pay | Admitting: Obstetrics and Gynecology

## 2019-09-17 NOTE — Telephone Encounter (Signed)
Spoke with patient. Patient states she has not contacted [facility] regarding the Barbette Merino, she confirmed that she did receive voicemail. Patient states she is unsure if she wants to start medication due to out of pocket cost. Bleeding has stopped while taking Aygesting 5mg  tid. Completed abx today. Planning surgery for September.   Recommended patient contact [place] [facility] at [phone] and request to speak with a nurse ambassador, they will set her up for the delivery of 28 day trial and be able to review benefits for continuing Oriahnn after 30 days. Patient is agreeable. Advised patient to return call to office to advise if she decides not to start [facility], will review options with Dr. Talbert Nan as Aygestin 5 mg tid is not long term. Patient verbalizes understanding and is agreeable.   Routing to Dr. Rosann Auerbach.

## 2019-09-17 NOTE — Telephone Encounter (Signed)
Call to patient to review benefits for recommended surgery with Dr. Talbert Nan. Unable to leave voicemail as voicemail has not been set up.

## 2019-09-17 NOTE — Telephone Encounter (Signed)
Spoke with patient regarding surgery benefits. Patient acknowledges understanding of information presented. Patient is aware that benefits presented are for professional benefits only. Patient is aware that once surgery is scheduled, the hospital will call with separate benefits. Patient is aware of surgery cancellation policy.  Patient would like to plan for surgery around September or October. Patient stated that she would call back when she is ready to proceed with scheduling.

## 2019-09-18 ENCOUNTER — Telehealth: Payer: Self-pay

## 2019-09-18 ENCOUNTER — Encounter: Payer: BC Managed Care – PPO | Admitting: Nurse Practitioner

## 2019-09-18 NOTE — Telephone Encounter (Signed)
Per Cassandra Rue NP sent schedule message to get patient rescheduled for her missed NEW HEM appointment today.

## 2019-09-18 NOTE — Telephone Encounter (Signed)
TC to patient to check on her since she was late to her appointment today. Unable to reach patient. Left voicemail for patient to call back [place].

## 2019-09-18 NOTE — Telephone Encounter (Signed)
Patient was scheduled for Cassandra Kim appointment for today that she no show. This appointment was made by [facility] per referral request. I left a full detailed message on the patients voicemail with appointment details; however, the patient states she was not aware.   I spoke with the patient at 3:15pm today and she states she was recently in the hospital and her iron is back up now. She states she does not feel this referral is still needed.

## 2019-09-19 MED ORDER — IBUPROFEN 800 MG PO TABS: 800.0000 mg | ORAL_TABLET | Freq: Three times a day (TID) | ORAL | 0 refills | Status: DC | PRN

## 2019-09-19 NOTE — Telephone Encounter (Signed)
PA completed for [facility], reviewed and signed by Dr. Talbert Nan. Faxed to  [employer].   OK to refill Ibuprofen 800 mg #30/0RF. Should not be used continuously, should call for OV for new or worsening pain.   Hematology referral still recommended. If patient not seen by hematology, should return to office for CBC and ferritin labs.    Call to patient, advised as seen above. Patient states she will schedule hematology appt on her own time, declines to schedule at this time. States she is unable to take anymore time off of work, concerned about additional cost. Patient declines to schedule labs, patient request labs not be ordered at this time. Patient states she will call prior to her surgery in September to schedule lab appt. Advised patient labs should be repeated now if not seen by hematology, not in September. Labs are recommended to determine if  hemoglobin/iron levels are stable. Patient again declines to schedule lab appt. Patient states she understands and declines to schedule. Advised patient I will update Dr. Talbert Nan, our office will f/u if any additional recommendations.   Routing to Dr. Talbert Nan  Cc: Magdalene Patricia

## 2019-09-19 NOTE — Telephone Encounter (Signed)
Call to patient, no answer. Unable to leave message.   PA Required for [facility].  Call to confirm past medication Hx with patient.   Per review of chart, has tried Provera 10 mg and Aygestin for bleeding, no contraceptive.   Has patient ever tried contraceptives?

## 2019-09-19 NOTE — Telephone Encounter (Signed)
Spoke with patient.   1. Confirmed patient has not been on an oral contraceptives. Explained PA needed for Barbette Merino, will complete and fax to [facility]. Will notify of response. Patient is concerned about cost of medication. Advised patient our office will submit PA, if approved, will then be able to determine out of pocket cost. [facility] will also be able to assist with this process. Patient states she wants to try Barbette Merino trail, has not contacted [facility] to start. Advised patient she will need to contact [place] [facility] directly and request to speak with nurse ambassador to start process.   2. Patient reports bleeding is scant, notices only when wiping, nothing on the pad.   3. Patient asking if hematology referral is still needed, advised yes, she should reach back out to hematology to schedule. Will update our referral coordinator.   4. Patient is requesting refill of Ibuprofen 800 mg tab. Advised patient I will have to review request with Dr. Talbert Nan and return call.   5. Patient request to speak with business office, number provided.   Advised patient I will review with Dr. Talbert Nan and return call.

## 2019-09-24 NOTE — Telephone Encounter (Signed)
PA response received via fax. PA denied for [facility].

## 2019-09-26 ENCOUNTER — Telehealth: Payer: Self-pay

## 2019-09-26 NOTE — Telephone Encounter (Signed)
[facility] called in regards to a prior authorization for medication Oriahnn. Stated a fax was sent a week ago and can be reached at [phone] or [phone] ext [phone]

## 2019-09-26 NOTE — Telephone Encounter (Signed)
Call returned to [place] [facility], spoke with [facility]. Was advised she is unable to determine extension provided. Provided update on PA for [facility]. Tye [place] was unable to determine if trial of medication was delivered to patient to date. Was placed on hold to verify this with the pharmacy. Call was transferred to [facility] with [facility], states no trial sent. States they have reached out to patient multiple times, left messages, no return calls.    Dr. Talbert Nan -  PA denied for [facility].   Patient has not contacted [facility] to initiate trial prescription.   How would you like to proceed?   Cc: Reesa Chew, RN

## 2019-09-26 NOTE — Telephone Encounter (Signed)
See telephone encounter dated 09/26/19.

## 2019-09-27 ENCOUNTER — Other Ambulatory Visit: Payer: Self-pay | Admitting: Obstetrics and Gynecology

## 2019-09-27 ENCOUNTER — Telehealth: Payer: Self-pay | Admitting: Obstetrics and Gynecology

## 2019-09-27 NOTE — Telephone Encounter (Signed)
Patient would like to discuss surgery scheduling. 

## 2019-10-01 NOTE — Telephone Encounter (Signed)
Patient is ready to schedule her surgery. Patient will not be available until 12:00pm for return call.

## 2019-10-01 NOTE — Telephone Encounter (Signed)
Attempted to reach patient at number provided. There was no answer and voicemail box is not set up.

## 2019-10-01 NOTE — Telephone Encounter (Signed)
Patient is returning call to Kaitlyn.  

## 2019-10-02 NOTE — Telephone Encounter (Signed)
Routing to Dr. Rosann Auerbach.

## 2019-10-02 NOTE — Telephone Encounter (Signed)
Makayla from [facility] called in regards to closing account for patient due to patient not contacting them in regads to medication.

## 2019-10-02 NOTE — Telephone Encounter (Signed)
Spoke with patient. Patient would like to proceed with surgery on 02/03/2020 as this is when her FMLA will kick in. Surgery scheduled for 02/03/2020 at 0730 at [facility]. Patient will return call for additional surgery planning on her next break from work.

## 2019-10-09 NOTE — Telephone Encounter (Signed)
Spoke with patient. Pre op appointment scheduled for 01/16/2020 at 4:30 pm with Dr.Jertson. COVID test scheduled for 01/30/2020 at 3:10 pm at [facility] location. Patient is aware of the need to quarantine after test until surgery. 1 week post scheduled for 02/10/2020 at 2:30 pm with Dr.Jertson. 4 week post op scheduled for 03/22/2020 at 1 pm with Dr.Jertson. Surgery instructions reviewed and mailed to patient's verified home address on file.  Routing to provider and will close encounter.

## 2019-10-09 NOTE — Telephone Encounter (Signed)
Attempted to reach patient at number provided to discuss appointment scheduling for surgery. Unable to reach and no voicemail box is set up.

## 2019-10-10 ENCOUNTER — Telehealth: Payer: Self-pay

## 2019-10-10 NOTE — Telephone Encounter (Signed)
Received phone call from [place] at [facility] about denied PA.  Instructed to call back to [facility] and resubmit PA to get clearance for pt's Rx for [place].   Call placed to pt. Pt states has not called [employer] back to set up delivery of Orihann free trial. Pt states is going to call today to get that sent. Pt states started bleeding today again. No details given since driving, but wants to ask Dr Talbert Nan for refill on Iron supplement. Pt made aware of RF at pharmacy. Pt agreeable and will pick up Rx Iron Ferrex when ready.   Routing to Dr Talbert Nan for review.

## 2019-10-11 NOTE — Telephone Encounter (Signed)
Called and spoke with Corinne at [facility] to appeal denied PA.  Verbal appeal approved from 10/11/19-10/10/2021. Verbal appeal to be faxed to office.   Spoke with Arbie Cookey -rep at [facility] for [place]. Made aware of verbal appeal approval. To fax paper Rx to continue [place] Rx until surgery. Reviewed and signed by Dr Talbert Nan. [facility]   Spoke with pt. Pt made aware of Rx and pt states called and has trial Milta Deiters being sent to home and will start. Pt states has surgery scheduled for 02/03/2020. Pt verbalized understanding and agreeable to Rx.   Routing to Dr Talbert Nan for review.  Encounter closed.  Cc: Verline Lema, RN for surgery planning update.

## 2019-10-17 ENCOUNTER — Telehealth: Payer: Self-pay

## 2019-10-17 NOTE — Telephone Encounter (Signed)
Spoke with pt. Pt reports having nausea, dizziness, intermittent sweating, feeling clammy, had fever yesterday of 101, and has vomited x 2. Pt states started trial of Orihann on 6/15 night with dinner and woke up on 6/16 am feeling these effects. Pt denies being around anyone sick. Pt also states having light pink spotting every day, changing panty liner as needed. Did have some abd cramps and took 1 Ibuprofen Rx tablet this morning and the cramps resolved.  Pt states had to leave work today due to feeling so tired and unable to perform work duties.  Advised will review with Dr Talbert Nan and return call. Pt agreeable.   Routing to Dr Talbert Nan

## 2019-10-17 NOTE — Telephone Encounter (Signed)
Please call and check on her tomorrow

## 2019-10-17 NOTE — Telephone Encounter (Signed)
Reviewed with Dr Talbert Nan and advised for pt to go PCP or urgent care to rule out virus or sickness.   Call placed back to pt. Spoke with pt. Pt states feeling queasy in stomach and is going to take an alkasetzer to see if it settles. Denies any vomiting at this time. Pt agreeable to call PCP for appt to be seen today. Pt to call back and give update. Pt agreeable and verbalized understanding.

## 2019-10-17 NOTE — Telephone Encounter (Signed)
Patient is calling in regards to feeling sick after taking new medication.

## 2019-10-18 NOTE — Telephone Encounter (Signed)
Attempted to call pt, no answer and voicemail not set up. Will wait for pt to call and try calling pt at later time.

## 2019-10-22 NOTE — Telephone Encounter (Signed)
Attempted to call pt, VM not set up and unable to leave message, mychart portal not set up.

## 2019-10-30 NOTE — Telephone Encounter (Signed)
Pt returned call to office. Pt states is doing well on Orihann and is having no other issues. Pt states she found out that she cant skip breakfast in the morning with her morning dose because that was causing her nausea and vomiting. Pt states doing better now and tolerating medications as long as she is eating a meal with it.   Routing to Dr Talbert Nan for update.  Encounter closed.

## 2019-10-30 NOTE — Telephone Encounter (Signed)
Call placed to pt for update on sx. No answer, no VM set up.   Routing to Dr Talbert Nan for review. Please advise.

## 2019-12-11 ENCOUNTER — Other Ambulatory Visit: Payer: Self-pay | Admitting: Obstetrics and Gynecology

## 2019-12-11 DIAGNOSIS — Z862 Personal history of diseases of the blood and blood-forming organs and certain disorders involving the immune mechanism: Secondary | ICD-10-CM

## 2019-12-11 NOTE — Telephone Encounter (Signed)
Medication refill request: Ferrex 150mg  Last OV: 09/12/19 Next OV: 01/16/20 Last MMG (if hormonal medication request): NA Refill authorized: 30/1

## 2020-01-16 ENCOUNTER — Other Ambulatory Visit: Payer: Self-pay

## 2020-01-16 ENCOUNTER — Ambulatory Visit (INDEPENDENT_AMBULATORY_CARE_PROVIDER_SITE_OTHER): Payer: BC Managed Care – PPO | Admitting: Obstetrics and Gynecology

## 2020-01-16 ENCOUNTER — Encounter: Payer: Self-pay | Admitting: Obstetrics and Gynecology

## 2020-01-16 VITALS — BP 138/72 | HR 75 | Ht 64.0 in | Wt 245.0 lb

## 2020-01-16 DIAGNOSIS — Z8639 Personal history of other endocrine, nutritional and metabolic disease: Secondary | ICD-10-CM

## 2020-01-16 DIAGNOSIS — G4730 Sleep apnea, unspecified: Secondary | ICD-10-CM

## 2020-01-16 DIAGNOSIS — Z8742 Personal history of other diseases of the female genital tract: Secondary | ICD-10-CM

## 2020-01-16 DIAGNOSIS — D259 Leiomyoma of uterus, unspecified: Secondary | ICD-10-CM

## 2020-01-16 DIAGNOSIS — N939 Abnormal uterine and vaginal bleeding, unspecified: Secondary | ICD-10-CM

## 2020-01-16 DIAGNOSIS — R21 Rash and other nonspecific skin eruption: Secondary | ICD-10-CM

## 2020-01-16 DIAGNOSIS — Z01818 Encounter for other preprocedural examination: Secondary | ICD-10-CM

## 2020-01-16 MED ORDER — NYSTATIN 100000 UNIT/GM EX CREA
1.0000 | TOPICAL_CREAM | Freq: Two times a day (BID) | CUTANEOUS | 0 refills | Status: DC
Start: 2020-01-16 — End: 2020-03-02

## 2020-01-16 NOTE — Progress Notes (Signed)
GYNECOLOGY  VISIT   HPI: 44 y.o. G0  Single Black or African American Not Hispanic or Latino  female   G0P0000 with No LMP recorded. (Menstrual status: Irregular Periods).   here for preoperative visit for a hysterectomy. She has a h/o a symptomatic fibroid uterus with AUB leading to anemia. Prior h/o uterine artery embolization.  Ultrasound from 07/19/19: fibroid uterus. Overall uterine measurement 16.1 x 8.7 x 9.9 cm. 7.6 x 6.9 x 8 cm mass in the upper uterus c/w fibroid. Extends transmural and obscuring the endometrium.   She was originally seen in 4/21 c/o 6 months of continuous bleeding, her hgb was 8.2, ferritin was 6. Treated with iron transfusion.  Her exam was concerning for PID and she was treated initially as an outpatient and was then admitted for IV antibiotics.  Her endometrial biopsy was c/w endometritis, +GBS. Negative GC/CT/Trich  Pap 09/12/19: negative, negative HPV.  H/O AODM. She has had a dramatic change of her diet starting this month. She has been doing much better.   She has been treated with Barbette Merino. She is having daily spotting, occasional gush of blood. Much better, pain is gone.   Not sexually active, hasn't been in 9 years.  GYNECOLOGIC HISTORY: No LMP recorded. (Menstrual status: Irregular Periods). Contraception: abstains Menopausal hormone therapy: no        OB History    Gravida  0   Para  0   Term  0   Preterm  0   AB  0   Living  0     SAB  0   TAB  0   Ectopic  0   Multiple  0   Live Births  0              Patient Active Problem List   Diagnosis Date Noted  . Pelvic inflammatory disease 09/03/2019  . Pelvic infection in female 09/03/2019  . Paresthesias 07/14/2019  . Abnormal uterine bleeding 07/06/2019  . Iron deficiency anemia due to chronic blood loss 07/06/2019  . Type II diabetes mellitus ([place]) 07/03/2019  . Sleep apnea 05/02/2013    Past Medical History:  Diagnosis Date  . Abnormal uterine bleeding   . Anemia    . Diabetes mellitus without complication ([place])   . Fibroid   . Obesity     Past Surgical History:  Procedure Laterality Date  . BREAST REDUCTION SURGERY Bilateral   . HYSTEROSCOPY WITH SALPINGOGRAM    . KIDNEY STONE SURGERY    . LAPAROSCOPIC GASTRIC SLEEVE RESECTION    . PANNICULECTOMY      Current Outpatient Medications  Medication Sig Dispense Refill  . FERREX 150 150 MG capsule TAKE 1 CAPSULE BY MOUTH EVERY DAY 30 capsule 1  . metFORMIN (GLUCOPHAGE-XR) 500 MG 24 hr tablet Take 1 tablet (500 mg total) by mouth daily with breakfast. 180 tablet 3  . ORIAHNN 300-1-0.5 & 300 MG CPPK     . acetaminophen (TYLENOL) 500 MG tablet Take 500 mg by mouth every 6 (six) hours as needed for moderate pain. (Patient not taking: Reported on 01/16/2020)    . ibuprofen (ADVIL) 800 MG tablet Take 1 tablet (800 mg total) by mouth every 8 (eight) hours as needed. (Patient not taking: Reported on 01/16/2020) 30 tablet 0   No current facility-administered medications for this visit.     ALLERGIES: Patient has no known allergies.  Family History  Problem Relation Age of Onset  . Asthma Mother   . Diabetes Mother   .  Cancer Sister   Sister died at 50 of spine/brain cancer.   Social History   Socioeconomic History  . Marital status: Single    Spouse name: Not on file  . Number of children: Not on file  . Years of education: Not on file  . Highest education level: Not on file  Occupational History  . Not on file  Tobacco Use  . Smoking status: Never Smoker  . Smokeless tobacco: Never Used  Vaping Use  . Vaping Use: Never used  Substance and Sexual Activity  . Alcohol use: Not Currently  . Drug use: Never  . Sexual activity: Not Currently    Partners: Female    Birth control/protection: Abstinence    Comment: last SA x9 years ago per patient  Other Topics Concern  . Not on file  Social History Narrative  . Not on file   Social Determinants of Health   Financial Resource Strain:    . Difficulty of Paying Living Expenses: Not on file  Food Insecurity:   . Worried About Charity fundraiser in the Last Year: Not on file  . Ran Out of Food in the Last Year: Not on file  Transportation Needs:   . Lack of Transportation (Medical): Not on file  . Lack of Transportation (Non-Medical): Not on file  Physical Activity:   . Days of Exercise per Week: Not on file  . Minutes of Exercise per Session: Not on file  Stress:   . Feeling of Stress : Not on file  Social Connections:   . Frequency of Communication with Friends and Family: Not on file  . Frequency of Social Gatherings with Friends and Family: Not on file  . Attends Religious Services: Not on file  . Active Member of Clubs or Organizations: Not on file  . Attends Archivist Meetings: Not on file  . Marital Status: Not on file  Intimate Partner Violence:   . Fear of Current or Ex-Partner: Not on file  . Emotionally Abused: Not on file  . Physically Abused: Not on file  . Sexually Abused: Not on file    Review of Systems  Constitutional: Negative.   HENT: Negative.   Eyes: Negative.   Respiratory: Negative.   Cardiovascular: Negative.   Gastrointestinal: Negative.   Genitourinary: Negative.   Musculoskeletal: Negative.   Skin: Negative.   Neurological: Negative.   Endo/Heme/Allergies: Negative.   Psychiatric/Behavioral: Negative.   She c/o an itchy rash on her upper inner thighs.   PHYSICAL EXAMINATION:    BP 138/72 (BP Location: Right Arm, Patient Position: Sitting, Cuff Size: Large)   Pulse 75   Ht 5\' 4"  (1.626 m)   Wt 245 lb (111.1 kg)   SpO2 99%   BMI 42.05 kg/m     General appearance: alert, cooperative and appears stated age Neck: no adenopathy, supple, symmetrical, trachea midline and thyroid normal to inspection and palpation Heart: regular rate and rhythm Lungs: CTAB Abdomen: soft, non-tender; bowel sounds normal; no masses,  no organomegaly Extremities: normal, atraumatic, no  cyanosis Skin: normal color, texture and turgor, rash on upper inner thighs bilaterally. Increased pigmentation Lymph: normal cervical supraclavicular and inguinal nodes Neurologic: grossly normal   Pelvic: External genitalia:  no lesions              Urethra:  normal appearing urethra with no masses, tenderness or lesions              Bartholins and Skenes: normal  Cervix: no cervical motion tenderness              Bimanual Exam:  Uterus:  exam limited by BMI, no mases or tenderness              Adnexa: no mass, fullness, tenderness                Chaperone was present for exam.  ASSESSMENT Symptomatic fibroid uterus, AUB leading to anemia.  H/O PID On Oriahnn with improvement in bleeding and pain. Exam is limited with BMI, but I suspect her uterus is smaller Rash on upper thighs, suspect candida intertrigo    PLAN Discussed total laparoscopic hysterectomy, bilateral salpingectomies and cystoscopy. Reviewed the risks of the procedure, including infection, bleeding, damage to bowel/badder/vessels/ureters.  Discussed the possible need for laparotomy. Discussed post operative recovery and risk of cuff dehiscence. All of her questions were answered Random glucose 194 (just had [employer]) Hgb 13.5 gm/dl Return for CBC, CMP, HgbA1C

## 2020-01-18 ENCOUNTER — Other Ambulatory Visit: Payer: Self-pay | Admitting: Obstetrics and Gynecology

## 2020-01-18 DIAGNOSIS — Z862 Personal history of diseases of the blood and blood-forming organs and certain disorders involving the immune mechanism: Secondary | ICD-10-CM

## 2020-01-20 NOTE — Telephone Encounter (Signed)
Medication refill request: niferex 150mg   Last OV:  01/16/20 Next AEX: 02/10/20 Last MMG (if hormonal medication request): NA Refill authorized: 30/1

## 2020-01-25 ENCOUNTER — Other Ambulatory Visit: Payer: Self-pay | Admitting: Obstetrics and Gynecology

## 2020-01-25 DIAGNOSIS — Z862 Personal history of diseases of the blood and blood-forming organs and certain disorders involving the immune mechanism: Secondary | ICD-10-CM

## 2020-01-27 NOTE — Telephone Encounter (Signed)
Medication refill request: Ferrex 150 caps, #90, R1 Last AEX:  08-22-19 Next AEX: not scheduled Last MMG (if hormonal medication request): Never Refill authorized: Pharmacy requesting 90-day supply  Routed to provider

## 2020-01-29 ENCOUNTER — Telehealth: Payer: Self-pay

## 2020-01-29 NOTE — Progress Notes (Signed)
Pt. Needs orders for upcomming surgery.PST appointment on 01/31/20.Thanks.

## 2020-01-29 NOTE — Telephone Encounter (Signed)
Patient returning my call. She has been informed that she needs to come get lab work in order to do surgery on Monday. Patient is needing an A1C. Also patient is needing pre-surgery covid screening. Patient states that she can not take off work and cannot come get her lab work done. Other [facility] sites have been offered to her but none work with her work schedule. Patient to return call to get address for testing.

## 2020-01-29 NOTE — Patient Instructions (Signed)
DUE TO COVID-19 ONLY ONE VISITOR IS ALLOWED IN WAITING ROOM (VISITOR WILL HAVE A TEMPERATURE CHECK ON ARRIVAL AND MUST WEAR A FACE MASK THE ENTIRE TIME.)  ONCE YOU ARE ADMITTED TO YOUR PRIVATE ROOM, THE SAME ONE VISITOR IS ALLOWED TO VISIT DURING VISITING HOURS ONLY.  Your COVID swab testing is scheduled for: 01/30/20  , You must self quarantine after your testing per handout given to you at the testing site. [address] [place], [place] [address]  (Must self quarantine after testing. Follow instructions on handout.)       Your procedure is scheduled on:02/03/20  Report to [facility] AT: 5:30  A. M.   Call this number if you have problems the morning of surgery:[phone].   OUR ADDRESS IS [address].  WE ARE LOCATED IN THE [place]                                   [facility].                                     REMEMBER:   DO NOT EAT FOOD OR DRINK LIQUIDS AFTER MIDNIGHT .    BRUSH YOUR TEETH THE MORNING OF SURGERY.  How to Manage Your Diabetes Before and After Surgery  Why is it important to control my blood sugar before and after surgery? . Improving blood sugar levels before and after surgery helps healing and can limit problems. . A way of improving blood sugar control is eating a healthy diet by: o  Eating less sugar and carbohydrates o  Increasing activity/exercise o  Talking with your doctor about reaching your blood sugar goals . High blood sugars (greater than 180 mg/dL) can raise your risk of infections and slow your recovery, so you will need to focus on controlling your diabetes during the weeks before surgery. . Make sure that the doctor who takes care of your diabetes knows about your planned surgery including the date and location.  How do I manage my blood sugar before surgery? . Check your blood sugar at least 4 times a day, starting 2 days before surgery, to make sure that the level is not too high or low. o Check your blood sugar  the morning of your surgery when you wake up and every 2 hours until you get to the Short Stay unit. . If your blood sugar is less than 70 mg/dL, you will need to treat for low blood sugar: o Do not take insulin. o Treat a low blood sugar (less than 70 mg/dL) with  cup of clear juice (cranberry or apple), 4 glucose tablets, OR glucose gel. o Recheck blood sugar in 15 minutes after treatment (to make sure it is greater than 70 mg/dL). If your blood sugar is not greater than 70 mg/dL on recheck, call [phone] for further instructions. . Report your blood sugar to the short stay nurse when you get to Short Stay.  . If you are admitted to the hospital after surgery: o Your blood sugar will be checked by the staff and you will probably be given insulin after surgery (instead of oral diabetes medicines) to make sure you have good blood sugar levels. o The goal for blood sugar control after surgery is 80-180 mg/dL.   WHAT DO I DO ABOUT  MY DIABETES MEDICATION?  Marland Kitchen Do not take oral diabetes medicines (pills) the morning of surgery.  . THE DAY BEFORE SURGERY, take Metformin as usual.     . THE MORNING OF SURGERY, DO NOT take Metformin.  DO NOT WEAR JEWERLY, MAKE UP, OR NAIL POLISH.  DO NOT WEAR LOTIONS, POWDERS, PERFUMES/COLOGNE OR DEODORANT.  DO NOT SHAVE FOR 24 HOURS PRIOR TO DAY OF SURGERY.   CONTACTS, GLASSES, OR DENTURES MAY NOT BE WORN TO SURGERY.                                    [facility] IS NOT RESPONSIBLE  FOR ANY BELONGINGS.         YOU MAY BRING A SMALL OVERNIGHT [facility] - Preparing for Surgery Before surgery, you can play an important role.  Because skin is not sterile, your skin needs to be as free of germs as possible.  You can reduce the number of germs on your skin by washing with CHG (chlorahexidine gluconate) soap before surgery.  CHG is an antiseptic cleaner which kills germs and bonds with the  skin to continue killing germs even after washing. Please DO NOT use if you have an allergy to CHG or antibacterial soaps.  If your skin becomes reddened/irritated stop using the CHG and inform your nurse when you arrive at Short Stay. Do not shave (including legs and underarms) for at least 48 hours prior to the first CHG shower.  You may shave your face/neck. Please follow these instructions carefully:  1.  Shower with CHG Soap the night before surgery and the  morning of Surgery.  2.  If you choose to wash your hair, wash your hair first as usual with your  normal  shampoo.  3.  After you shampoo, rinse your hair and body thoroughly to remove the  shampoo.                           4.  Use CHG as you would any other liquid soap.  You can apply chg directly  to the skin and wash                       Gently with a scrungie or clean washcloth.  5.  Apply the CHG Soap to your body ONLY FROM THE NECK DOWN.   Do not use on face/ open                           Wound or open sores. Avoid contact with eyes, ears mouth and genitals (private parts).                       Wash face,  Genitals (private parts) with your normal soap.             6.  Wash thoroughly, paying special attention to the area where your surgery  will be performed.  7.  Thoroughly rinse  your body with warm water from the neck down.  8.  DO NOT shower/wash with your normal soap after using and rinsing off  the CHG Soap.                9.  Pat yourself dry with a clean towel.            10.  Wear clean pajamas.            11.  Place clean sheets on your bed the night of your first shower and do not  sleep with pets. Day of Surgery : Do not apply any lotions/deodorants the morning of surgery.  Please wear clean clothes to the hospital/surgery center.  FAILURE TO FOLLOW THESE INSTRUCTIONS MAY RESULT IN THE CANCELLATION OF YOUR SURGERY PATIENT SIGNATURE_________________________________  NURSE  SIGNATURE__________________________________  ________________________________________________________________________

## 2020-01-29 NOTE — Telephone Encounter (Signed)
Call to patient to discuss importance of HgbA1C testing. Unable to leave message. Voice mail not set up. Not active on My Chart.

## 2020-01-30 ENCOUNTER — Other Ambulatory Visit: Payer: Self-pay

## 2020-01-30 ENCOUNTER — Other Ambulatory Visit (HOSPITAL_COMMUNITY)
Admission: RE | Admit: 2020-01-30 | Discharge: 2020-01-30 | Disposition: A | Discharge status: Home / Self Care | Payer: BC Managed Care – PPO | Source: Ambulatory Visit | Attending: Obstetrics and Gynecology | Admitting: Obstetrics and Gynecology

## 2020-01-30 ENCOUNTER — Other Ambulatory Visit (INDEPENDENT_AMBULATORY_CARE_PROVIDER_SITE_OTHER): Payer: BC Managed Care – PPO

## 2020-01-30 DIAGNOSIS — N939 Abnormal uterine and vaginal bleeding, unspecified: Secondary | ICD-10-CM

## 2020-01-30 DIAGNOSIS — Z01818 Encounter for other preprocedural examination: Secondary | ICD-10-CM

## 2020-01-30 DIAGNOSIS — Z8639 Personal history of other endocrine, nutritional and metabolic disease: Secondary | ICD-10-CM

## 2020-01-30 DIAGNOSIS — Z01812 Encounter for preprocedural laboratory examination: Secondary | ICD-10-CM | POA: Diagnosis not present

## 2020-01-30 DIAGNOSIS — Z20822 Contact with and (suspected) exposure to covid-19: Secondary | ICD-10-CM | POA: Insufficient documentation

## 2020-01-30 LAB — SARS CORONAVIRUS 2 (TAT 6-24 HRS): SARS Coronavirus 2: NEGATIVE

## 2020-01-30 NOTE — Telephone Encounter (Signed)
Patient advised she must come in today for HgbA1c. Patiennt will come following Covid test.

## 2020-01-31 ENCOUNTER — Telehealth: Payer: Self-pay | Admitting: *Deleted

## 2020-01-31 ENCOUNTER — Encounter (HOSPITAL_COMMUNITY)
Admission: RE | Admit: 2020-01-31 | Discharge: 2020-01-31 | Disposition: A | Discharge status: Home / Self Care | Payer: BC Managed Care – PPO | Source: Ambulatory Visit | Attending: Obstetrics and Gynecology | Admitting: Obstetrics and Gynecology

## 2020-01-31 LAB — CBC
Hematocrit: 40.4 % (ref 34.0–46.6)
Hemoglobin: 13.1 g/dL (ref 11.1–15.9)
MCH: 24.9 pg — ABNORMAL LOW (ref 26.6–33.0)
MCHC: 32.4 g/dL (ref 31.5–35.7)
MCV: 77 fL — ABNORMAL LOW (ref 79–97)
Platelets: 230 10*3/uL (ref 150–450)
RBC: 5.26 x10E6/uL (ref 3.77–5.28)
RDW: 15.5 % — ABNORMAL HIGH (ref 11.7–15.4)
WBC: 6 10*3/uL (ref 3.4–10.8)

## 2020-01-31 LAB — COMPREHENSIVE METABOLIC PANEL
ALT: 26 IU/L (ref 0–32)
AST: 20 IU/L (ref 0–40)
Albumin/Globulin Ratio: 1.3 (ref 1.2–2.2)
Albumin: 3.9 g/dL (ref 3.8–4.8)
Alkaline Phosphatase: 93 IU/L (ref 44–121)
BUN/Creatinine Ratio: 23 (ref 9–23)
BUN: 14 mg/dL (ref 6–24)
Bilirubin Total: <0.2 mg/dL (ref 0.0–1.2)
CO2: 28 mmol/L (ref 20–29)
Calcium: 9.4 mg/dL (ref 8.7–10.2)
Chloride: 102 mmol/L (ref 96–106)
Creatinine, Ser: 0.61 mg/dL (ref 0.57–1.00)
GFR calc Af Amer: 128 mL/min/{1.73_m2} (ref >59)
GFR calc non Af Amer: 111 mL/min/{1.73_m2} (ref >59)
Globulin, Total: 3.1 g/dL (ref 1.5–4.5)
Glucose: 148 mg/dL — ABNORMAL HIGH (ref 65–99)
Potassium: 4.2 mmol/L (ref 3.5–5.2)
Sodium: 141 mmol/L (ref 134–144)
Total Protein: 7 g/dL (ref 6.0–8.5)

## 2020-01-31 LAB — HEMOGLOBIN A1C
Est. average glucose Bld gHb Est-mCnc: 292 mg/dL
Hgb A1c MFr Bld: 11.8 % — ABNORMAL HIGH (ref 4.8–5.6)

## 2020-01-31 MED ORDER — ORIAHNN 300-1-0.5 & 300 MG PO CPPK: 1.0000 | ORAL_CAPSULE | Freq: Two times a day (BID) | ORAL | 2 refills | Status: DC

## 2020-01-31 NOTE — Telephone Encounter (Signed)
Second call from patient at 1010. Has questions about goal range for HgbA1C to reschedule surgery. States she was previously feeling discouraged but feels she can get glucose controlled and would like to proceed with reschedule. Apologized for previous call.  Aware will need to have A1C in range of 7-8 and cleared by PCP and anesthesia.  She will see her PCP for possible medication adjustment and will make dietary changes. Discussed that usually recheck not indicated for approximately 6 weeks. Interested in planning surgery for 04-27-20 week allowing time for improvement of A1C.  Will proceed with rescheduling.

## 2020-01-31 NOTE — Telephone Encounter (Signed)
Reviewed Barbette Merino Rx with Dr. Talbert Nan.  Oriahnn 300mg  capsules take PO bid #[id number]/2RF sent to [facility] on file.

## 2020-01-31 NOTE — Telephone Encounter (Signed)
All to patient regarding lab results.  Unable to reach patient. No voice mail set up.

## 2020-01-31 NOTE — Telephone Encounter (Signed)
See result note from Dr Talbert Nan. Case discussed personally.  Patient returned call approximately 0900. Advised of improved Hgb results. Patient states she is taking Barbette Merino and advised to continue to keep Hgb stable. Advised of elevated Hgb A1C results and need to cancel elective surgery due to increased risk, up to and including risk of death. Discussed that cannot proceed with elective surgery with uncontrolled blood glucose.  She states she had run out of diabetes medication twice.     Patient reports she will not be able to proceed at later date. States surgery "can't" happen if canceled due to inability to move time off with employer. Requested refund of deposit paid.   Explained that decision not to reschedule was up to her. With her fibroids and previous anemia, would certainly recommend follow-up.  All surgical appointments canceled.

## 2020-01-31 NOTE — Telephone Encounter (Signed)
-----   Message from Salvadore Dom, MD sent at 01/31/2020  8:23 AM EDT ----- This patient's HgbA1C is 11.8 which is c/w uncontrolled DM. Her HgbA1C was 7.5 in 3/21. This makes surgery very dangerous for her. It increases her risk of infection and mortality. We are going to have to cancel her surgery, get her diabetes under control and then reschedule her surgery. She should stay on the [place]. The Barbette Merino will keep her bleeding under control, help shrink her uterus and make her surgery safer.

## 2020-01-31 NOTE — Addendum Note (Signed)
Addended by: Burnice Logan on: 01/31/2020 11:53 AM   Modules accepted: Orders

## 2020-02-03 ENCOUNTER — Encounter (HOSPITAL_BASED_OUTPATIENT_CLINIC_OR_DEPARTMENT_OTHER): Admission: RE | Payer: Self-pay | Source: Home / Self Care

## 2020-02-03 ENCOUNTER — Ambulatory Visit (HOSPITAL_BASED_OUTPATIENT_CLINIC_OR_DEPARTMENT_OTHER)
Admission: RE | Admit: 2020-02-03 | Payer: BC Managed Care – PPO | Source: Home / Self Care | Admitting: Obstetrics and Gynecology

## 2020-02-03 SURGERY — HYSTERECTOMY, TOTAL, LAPAROSCOPIC, WITH SALPINGECTOMY
Anesthesia: General

## 2020-02-04 ENCOUNTER — Telehealth: Payer: Self-pay

## 2020-02-04 NOTE — Telephone Encounter (Signed)
Routing to [employer], Therapist, sports

## 2020-02-04 NOTE — Telephone Encounter (Signed)
Patient is calling in regards to needing her surgery date.

## 2020-02-05 NOTE — Telephone Encounter (Signed)
Spoke with patient. Surgery scheduled for 04/28/2020 at 0730 at [facility]. Patient is agreeable to date and time. Patient is unable to schedule other appointments right now and review surgery instructions, but will call back to discuss.

## 2020-02-10 ENCOUNTER — Ambulatory Visit: Payer: BC Managed Care – PPO | Admitting: Obstetrics and Gynecology

## 2020-02-12 ENCOUNTER — Other Ambulatory Visit: Payer: Self-pay

## 2020-02-12 ENCOUNTER — Encounter: Payer: Self-pay | Admitting: Family Medicine

## 2020-02-12 ENCOUNTER — Ambulatory Visit (INDEPENDENT_AMBULATORY_CARE_PROVIDER_SITE_OTHER): Payer: BC Managed Care – PPO | Admitting: Family Medicine

## 2020-02-12 VITALS — BP 144/82 | HR 76 | Ht 64.0 in | Wt 248.0 lb

## 2020-02-12 DIAGNOSIS — Z23 Encounter for immunization: Secondary | ICD-10-CM | POA: Diagnosis not present

## 2020-02-12 DIAGNOSIS — Z0000 Encounter for general adult medical examination without abnormal findings: Secondary | ICD-10-CM

## 2020-02-12 DIAGNOSIS — Z114 Encounter for screening for human immunodeficiency virus [HIV]: Secondary | ICD-10-CM

## 2020-02-12 DIAGNOSIS — E119 Type 2 diabetes mellitus without complications: Secondary | ICD-10-CM

## 2020-02-12 DIAGNOSIS — L282 Other prurigo: Secondary | ICD-10-CM

## 2020-02-12 DIAGNOSIS — R809 Proteinuria, unspecified: Secondary | ICD-10-CM | POA: Insufficient documentation

## 2020-02-12 DIAGNOSIS — Z862 Personal history of diseases of the blood and blood-forming organs and certain disorders involving the immune mechanism: Secondary | ICD-10-CM

## 2020-02-12 DIAGNOSIS — Z1159 Encounter for screening for other viral diseases: Secondary | ICD-10-CM | POA: Diagnosis not present

## 2020-02-12 MED ORDER — HYDROXYZINE HCL 25 MG PO TABS
25.0000 mg | ORAL_TABLET | Freq: Three times a day (TID) | ORAL | 0 refills | Status: AC | PRN
Start: 2020-02-12 — End: ?

## 2020-02-12 MED ORDER — METFORMIN HCL ER 500 MG PO TB24: 1000.0000 mg | ORAL_TABLET | Freq: Every day | ORAL | 2 refills | Status: DC

## 2020-02-12 MED ORDER — GABAPENTIN 100 MG PO CAPS: 100.0000 mg | ORAL_CAPSULE | Freq: Every day | ORAL | 0 refills | Status: DC

## 2020-02-12 NOTE — Progress Notes (Signed)
    SUBJECTIVE:   CHIEF COMPLAINT / HPI: physical   T2DM  Reports intermittent itching and paresthesias in feet  Offered gabapentin but did not want to take due to concern for addictive properties  Improved symptoms after iron transfusions while in the hospital  Now having itching in her hands and feet  Reports she is still taking the metformin 1000mg  daily as she felt the 500mg  daily was not enough Hgb A1c elevated  11.8 as of 01/30/20  Planning to follow up with PCP on 02/18/20 [employer] joined, working to improve fitness and gets nutritionist with insurance plan   Rash on leg  Has been pruritic Tried nystatin in between from gynecologist  Has checked mattress and did not find signs of bed bugs    HM  Patient declines covid-19 vaccination and is uncomfortable with its newness.  Patient agreeable to influenza vaccine today  Had recent normal pap smear  Recommended tetanus  PERTINENT  PMH / PSH: T2DM  IDA   OBJECTIVE:   BP (!) 144/82   Pulse 76   Ht 5\' 4"  (1.626 m)   Wt 248 lb (112.5 kg)   SpO2 97%   BMI 42.57 kg/m   General: female appearing stated age in no acute distress HEENT: MMM, no oral lesions noted,Neck non-tender without lymphadenopathy Cardio: Normal S1 and S2, no S3 or S4. Rhythm is regular. No murmurs or rubs.  Bilateral radial pulses palpable Pulm: Clear to auscultation bilaterally, no crackles, wheezing, or diminished breath sounds. Normal respiratory effort Abdomen: Bowel sounds normal. Abdomen soft and non-tender.  Extremities: trace LE edema  Neuro: pt alert and oriented x4    ASSESSMENT/PLAN:   Healthcare maintenance Influenza vaccine  Patient declines covid-19 vaccine  Referral for ophthalmology for diabetes eye exam  Hep c screening  HIV screening   Type II diabetes mellitus ([place]) Patient to follow up with PCP on 10/19 to discuss further treatment  Patient has increased Metformin to 1000mg  daily, refills given as she has bene out  for last few weeks  Urine microalbumin collected today  Rx gabapentin for neuropathy symptoms    Pruritic rash Unable to identify rash on LE, however, given patient's reports of pruritis prescribed atarax 25 PRN      Eulis Foster, MD [place]

## 2020-02-12 NOTE — Patient Instructions (Signed)
It was a pleasure to see you today!  Thank you for choosing [facility] for your primary care.  Cassandra Kim was seen for phsyical.   Our plans for today were:  I have sent a prescription of refills for your Metformin.  Please continue to discuss management of your diabetes with your primary care doctor on your follow-up appointment in a few days.  I have prescribed Atarax to help with the itching.  I have also prescribed a low-dose of gabapentin to help with the neuropathy symptoms.  We will complete lab work today as recommended for your age for healthcare maintenance.  To keep you healthy, please keep in mind the following health maintenance items that you are due for:   1. Tetanus vaccine 2. Pneumonia vaccine 3. Consider the COVID-19 vaccination   You should return to our clinic as scheduled with Dr. Janus Molder  Best Wishes,   Dr. Alba Cory   Gabapentin capsules or tablets What is this medicine? GABAPENTIN (GA ba pen tin) is used to control seizures in certain types of epilepsy. It is also used to treat certain types of nerve pain. This medicine may be used for other purposes; ask your health care provider or pharmacist if you have questions. COMMON BRAND NAME(S): Active-PAC with Gabapentin, Gabarone, Neurontin What should I tell my health care provider before I take this medicine? They need to know if you have any of these conditions:  history of drug abuse or alcohol abuse problem  kidney disease  lung or breathing disease  suicidal thoughts, plans, or attempt; a previous suicide attempt by you or a family member  an unusual or allergic reaction to gabapentin, other medicines, foods, dyes, or preservatives  pregnant or trying to get pregnant  breast-feeding How should I use this medicine? Take this medicine by mouth with a glass of water. Follow the directions on the prescription label. You can take it with or without food. If it upsets your  stomach, take it with food. Take your medicine at regular intervals. Do not take it more often than directed. Do not stop taking except on your doctor's advice. If you are directed to break the 600 or 800 mg tablets in half as part of your dose, the extra half tablet should be used for the next dose. If you have not used the extra half tablet within 28 days, it should be thrown away. A special MedGuide will be given to you by the pharmacist with each prescription and refill. Be sure to read this information carefully each time. Talk to your pediatrician regarding the use of this medicine in children. While this drug may be prescribed for children as young as 3 years for selected conditions, precautions do apply. Overdosage: If you think you have taken too much of this medicine contact a poison control center or emergency room at once. NOTE: This medicine is only for you. Do not share this medicine with others. What if I miss a dose? If you miss a dose, take it as soon as you can. If it is almost time for your next dose, take only that dose. Do not take double or extra doses. What may interact with this medicine? This medicine may interact with the following medications:  alcohol  antihistamines for allergy, cough, and cold  certain medicines for anxiety or sleep  certain medicines for depression like amitriptyline, fluoxetine, sertraline  certain medicines for seizures like phenobarbital, primidone  certain medicines for stomach problems  general anesthetics  like halothane, isoflurane, methoxyflurane, propofol  local anesthetics like lidocaine, pramoxine, tetracaine  medicines that relax muscles for surgery  narcotic medicines for pain  phenothiazines like chlorpromazine, mesoridazine, prochlorperazine, thioridazine This list may not describe all possible interactions. Give your health care provider a list of all the medicines, herbs, non-prescription drugs, or dietary supplements you  use. Also tell them if you smoke, drink alcohol, or use illegal drugs. Some items may interact with your medicine. What should I watch for while using this medicine? Visit your doctor or health care provider for regular checks on your progress. You may want to keep a record at home of how you feel your condition is responding to treatment. You may want to share this information with your doctor or health care provider at each visit. You should contact your doctor or health care provider if your seizures get worse or if you have any new types of seizures. Do not stop taking this medicine or any of your seizure medicines unless instructed by your doctor or health care provider. Stopping your medicine suddenly can increase your seizures or their severity. This medicine may cause serious skin reactions. They can happen weeks to months after starting the medicine. Contact your health care provider right away if you notice fevers or flu-like symptoms with a rash. The rash may be red or purple and then turn into blisters or peeling of the skin. Or, you might notice a red rash with swelling of the face, lips or lymph nodes in your neck or under your arms. Wear a medical identification bracelet or chain if you are taking this medicine for seizures, and carry a card that lists all your medications. You may get drowsy, dizzy, or have blurred vision. Do not drive, use machinery, or do anything that needs mental alertness until you know how this medicine affects you. To reduce dizzy or fainting spells, do not sit or stand up quickly, especially if you are an older patient. Alcohol can increase drowsiness and dizziness. Avoid alcoholic drinks. Your mouth may get dry. Chewing sugarless gum or sucking hard candy, and drinking plenty of water will help. The use of this medicine may increase the chance of suicidal thoughts or actions. Pay special attention to how you are responding while on this medicine. Any worsening of  mood, or thoughts of suicide or dying should be reported to your health care provider right away. Women who become pregnant while using this medicine may enroll in the [place] Pregnancy Registry by calling [phone]. This registry collects information about the safety of antiepileptic drug use during pregnancy. What side effects may I notice from receiving this medicine? Side effects that you should report to your doctor or health care professional as soon as possible:  allergic reactions like skin rash, itching or hives, swelling of the face, lips, or tongue  breathing problems  rash, fever, and swollen lymph nodes  redness, blistering, peeling or loosening of the skin, including inside the mouth  suicidal thoughts, mood changes Side effects that usually do not require medical attention (report to your doctor or health care professional if they continue or are bothersome):  dizziness  drowsiness  headache  nausea, vomiting  swelling of ankles, feet, hands  tiredness This list may not describe all possible side effects. Call your doctor for medical advice about side effects. You may report side effects to FDA at 1-800-FDA-1088. Where should I keep my medicine? Keep out of reach of children. This medicine may  cause accidental overdose and death if it taken by other adults, children, or pets. Mix any unused medicine with a substance like cat litter or coffee grounds. Then throw the medicine away in a sealed container like a sealed bag or a coffee can with a lid. Do not use the medicine after the expiration date. Store at room temperature between 15 and 30 degrees C (59 and 86 degrees F). NOTE: This sheet is a summary. It may not cover all possible information. If you have questions about this medicine, talk to your doctor, pharmacist, or health care provider.  2020 Elsevier/Gold Standard (2018-07-20 14:16:43)   Hydroxyzine capsules or tablets What is  this medicine? HYDROXYZINE (hye [place] i zeen) is an antihistamine. This medicine is used to treat allergy symptoms. It is also used to treat anxiety and tension. This medicine can be used with other medicines to induce sleep before surgery. This medicine may be used for other purposes; ask your health care provider or pharmacist if you have questions. COMMON BRAND NAME(S): ANX, Atarax, Rezine, Vistaril What should I tell my health care provider before I take this medicine? They need to know if you have any of these conditions:  glaucoma  heart disease  history of irregular heartbeat  kidney disease  liver disease  lung or breathing disease, like asthma  stomach or intestine problems  thyroid disease  trouble passing urine  an unusual or allergic reaction to hydroxyzine, cetirizine, other medicines, foods, dyes or preservatives  pregnant or trying to get pregnant  breast-feeding How should I use this medicine? Take this medicine by mouth with a full glass of water. Follow the directions on the prescription label. You may take this medicine with food or on an empty stomach. Take your medicine at regular intervals. Do not take your medicine more often than directed. Talk to your pediatrician regarding the use of this medicine in children. Special care may be needed. While this drug may be prescribed for children as young as 68 years of age for selected conditions, precautions do apply. Patients over 76 years old may have a stronger reaction and need a smaller dose. Overdosage: If you think you have taken too much of this medicine contact a poison control center or emergency room at once. NOTE: This medicine is only for you. Do not share this medicine with others. What if I miss a dose? If you miss a dose, take it as soon as you can. If it is almost time for your next dose, take only that dose. Do not take double or extra doses. What may interact with this medicine? Do not take this  medicine with any of the following medications:  cisapride  dronedarone  pimozide  thioridazine This medicine may also interact with the following medications:  alcohol  antihistamines for allergy, cough, and cold  atropine  barbiturate medicines for sleep or seizures, like phenobarbital  certain antibiotics like erythromycin or clarithromycin  certain medicines for anxiety or sleep  certain medicines for bladder problems like oxybutynin, tolterodine  certain medicines for depression or psychotic disturbances  certain medicines for irregular heart beat  certain medicines for Parkinson's disease like benztropine, trihexyphenidyl  certain medicines for seizures like phenobarbital, primidone  certain medicines for stomach problems like dicyclomine, hyoscyamine  certain medicines for travel sickness like scopolamine  ipratropium  narcotic medicines for pain  other medicines that prolong the QT interval (an abnormal heart rhythm) like dofetilide This list may not describe all possible interactions. Give your  health care provider a list of all the medicines, herbs, non-prescription drugs, or dietary supplements you use. Also tell them if you smoke, drink alcohol, or use illegal drugs. Some items may interact with your medicine. What should I watch for while using this medicine? Tell your doctor or health care professional if your symptoms do not improve. You may get drowsy or dizzy. Do not drive, use machinery, or do anything that needs mental alertness until you know how this medicine affects you. Do not stand or sit up quickly, especially if you are an older patient. This reduces the risk of dizzy or fainting spells. Alcohol may interfere with the effect of this medicine. Avoid alcoholic drinks. Your mouth may get dry. Chewing sugarless gum or sucking hard candy, and drinking plenty of water may help. Contact your doctor if the problem does not go away or is severe. This  medicine may cause dry eyes and blurred vision. If you wear contact lenses you may feel some discomfort. Lubricating drops may help. See your eye doctor if the problem does not go away or is severe. If you are receiving skin tests for allergies, tell your doctor you are using this medicine. What side effects may I notice from receiving this medicine? Side effects that you should report to your doctor or health care professional as soon as possible:  allergic reactions like skin rash, itching or hives, swelling of the face, lips, or tongue  changes in vision  confusion  fast, irregular heartbeat  seizures  tremor  trouble passing urine or change in the amount of urine Side effects that usually do not require medical attention (report to your doctor or health care professional if they continue or are bothersome):  constipation  drowsiness  dry mouth  headache  tiredness This list may not describe all possible side effects. Call your doctor for medical advice about side effects. You may report side effects to FDA at 1-800-FDA-1088. Where should I keep my medicine? Keep out of the reach of children. Store at room temperature between 15 and 30 degrees C (59 and 86 degrees F). Keep container tightly closed. Throw away any unused medicine after the expiration date. NOTE: This sheet is a summary. It may not cover all possible information. If you have questions about this medicine, talk to your doctor, pharmacist, or health care provider.  2020 Elsevier/Gold Standard (2018-04-09 13:19:55)

## 2020-02-13 ENCOUNTER — Encounter: Payer: Self-pay | Admitting: Family Medicine

## 2020-02-13 LAB — HEPATITIS C ANTIBODY: Hep C Virus Ab: <0.1 s/co ratio (ref 0.0–0.9)

## 2020-02-13 LAB — MICROALBUMIN / CREATININE URINE RATIO
Creatinine, Urine: 126.7 mg/dL
Microalb/Creat Ratio: 85 mg/g creat — ABNORMAL HIGH (ref 0–29)
Microalbumin, Urine: 107.3 ug/mL

## 2020-02-13 LAB — HIV ANTIBODY (ROUTINE TESTING W REFLEX): HIV Screen 4th Generation wRfx: NONREACTIVE

## 2020-02-16 DIAGNOSIS — Z0000 Encounter for general adult medical examination without abnormal findings: Secondary | ICD-10-CM | POA: Insufficient documentation

## 2020-02-16 DIAGNOSIS — L282 Other prurigo: Secondary | ICD-10-CM | POA: Insufficient documentation

## 2020-02-16 NOTE — Assessment & Plan Note (Signed)
Unable to identify rash on LE, however, given patient's reports of pruritis prescribed atarax 25 PRN

## 2020-02-16 NOTE — Assessment & Plan Note (Addendum)
Patient to follow up with PCP on 10/19 to discuss further treatment  Patient has increased Metformin to 1000mg  daily, refills given as she has bene out for last few weeks  Urine microalbumin collected today  Rx gabapentin for neuropathy symptoms

## 2020-02-16 NOTE — Assessment & Plan Note (Addendum)
Influenza vaccine  Patient declines covid-19 vaccine  Referral for ophthalmology for diabetes eye exam  Hep c screening  HIV screening

## 2020-02-18 ENCOUNTER — Ambulatory Visit: Payer: BC Managed Care – PPO | Admitting: Family Medicine

## 2020-02-18 ENCOUNTER — Encounter: Payer: Self-pay | Admitting: Family Medicine

## 2020-02-18 ENCOUNTER — Other Ambulatory Visit: Payer: Self-pay

## 2020-02-18 VITALS — BP 142/80 | HR 79 | Ht 63.0 in | Wt 250.0 lb

## 2020-02-18 DIAGNOSIS — E119 Type 2 diabetes mellitus without complications: Secondary | ICD-10-CM | POA: Diagnosis not present

## 2020-02-18 DIAGNOSIS — I10 Essential (primary) hypertension: Secondary | ICD-10-CM

## 2020-02-18 DIAGNOSIS — Z23 Encounter for immunization: Secondary | ICD-10-CM

## 2020-02-18 LAB — GLUCOSE, POCT (MANUAL RESULT ENTRY): POC Glucose: 223 mg/dL — AB (ref 70–99)

## 2020-02-18 MED ORDER — LANCING DEVICE MISC: 1.0000 [IU] | Freq: Every day | 0 refills | Status: AC

## 2020-02-18 MED ORDER — BLOOD GLUCOSE MONITORING SUPPL W/DEVICE KIT: 1.0000 | PACK | Freq: Every day | 0 refills | Status: AC

## 2020-02-18 MED ORDER — ONETOUCH ULTRASOFT LANCETS MISC: 12 refills | Status: AC

## 2020-02-18 MED ORDER — DAPAGLIFLOZIN PROPANEDIOL 10 MG PO TABS: 10.0000 mg | ORAL_TABLET | Freq: Every day | ORAL | 2 refills | Status: DC

## 2020-02-18 MED ORDER — GLUCOSE BLOOD VI STRP
ORAL_STRIP | 12 refills | Status: AC
Start: 2020-02-18 — End: ?

## 2020-02-18 NOTE — Progress Notes (Signed)
    SUBJECTIVE:   CHIEF COMPLAINT / HPI:   Ms. Burnstein is a 44 yo F who presents for the below issue.   Diabetes Current Regimen: Metformin 1000mg  daily CBGs: POCT today 223, >2 hrs postprandial; does not check blood sugar at home at this time. Last A1c: 11.8 on 01/30/2020 Endorses polyuria, polyphagia, and parathesias Statin: N/A ACE/ARB: N/A  PERTINENT  PMH / PSH: AUB (scheduled for [facility] 04/28/2020 with Dr. Talbert Nan)  OBJECTIVE:   BP (!) 142/80   Pulse 79   Ht 5\' 3"  (1.6 m)   Wt 250 lb (113.4 kg)   SpO2 99%   BMI 44.29 kg/m   General: Appears well, no acute distress. Age appropriate. Cardiac: RRR, normal heart sounds, no murmurs Respiratory: CTAB, normal effort Extremities: No edema or cyanosis.  ASSESSMENT/PLAN:   Type II diabetes mellitus ([place]) Uncontrolled at this time. Patient due for surgery in December. Briefly communicated with Dr. Talbert Nan on achieving better control prior to surgery. Patient saw Dr. Alba Cory last week and metformin was increased from 500mg  to 1000mg  daily.  -Continue metformin 1000 mg daily; consider increasing to BID -Start farxiga 10 mg daily; patient aware of possible side effects -Glucometer and supplies Rx to pharmacy; log AM fasting glucoses -Follow up in 3 weeks and bring log; make medication adjustments at necessary   Elevated blood pressure reading in office with diagnosis of hypertension BP 142/80 in office today.  -Follow up BP at next office visit  Need for Tdap vaccination -Tdap vaccine greater than or equal to 7yo IM  Gerlene Fee, DO [place] .

## 2020-02-18 NOTE — Patient Instructions (Signed)
It was wonderful to see you today.  Please bring ALL of your medications with you to every visit.   Today we talked about:  Decreasing diabetes numbers. Continue to work on your dieting and increasing exercise movements. Continue metromin 1000mg  daily. I have prescribed farxiga 10 mg daily.   Please be sure to schedule follow up in 3 weeks at the front desk before you leave today. Bring glucose log.   Please call the clinic at [phone] if you have any concerns. It was our pleasure to serve you.  Dr. Janus Molder

## 2020-02-19 DIAGNOSIS — I10 Essential (primary) hypertension: Secondary | ICD-10-CM | POA: Insufficient documentation

## 2020-02-19 NOTE — Assessment & Plan Note (Addendum)
Uncontrolled at this time. Worsening; prior A1c was 7.5 in march now 11.8. Patient due for surgery in December. Briefly communicated with Dr. Talbert Nan on achieving better control prior to surgery. Patient saw Dr. Alba Cory last week and metformin was increased from 500mg  to 1000mg  daily.  -Continue metformin 1000 mg daily; consider increasing to BID -Start farxiga 10 mg daily; patient aware of possible side effects -Glucometer and supplies Rx to pharmacy; log AM fasting glucoses -Follow up in 3 weeks and bring log; make medication adjustments at necessary

## 2020-02-19 NOTE — Assessment & Plan Note (Signed)
BP 142/80 in office today.  -Follow up BP at next office visit

## 2020-03-02 ENCOUNTER — Other Ambulatory Visit: Payer: Self-pay

## 2020-03-02 ENCOUNTER — Ambulatory Visit (HOSPITAL_COMMUNITY)
Admission: EM | Admit: 2020-03-02 | Discharge: 2020-03-02 | Disposition: A | Discharge status: Home / Self Care | Payer: BC Managed Care – PPO | Attending: Family Medicine | Admitting: Family Medicine

## 2020-03-02 ENCOUNTER — Encounter (HOSPITAL_COMMUNITY): Payer: Self-pay

## 2020-03-02 DIAGNOSIS — K529 Noninfective gastroenteritis and colitis, unspecified: Secondary | ICD-10-CM | POA: Diagnosis not present

## 2020-03-02 MED ORDER — DIPHENOXYLATE-ATROPINE 2.5-0.025 MG PO TABS: 1.0000 | ORAL_TABLET | Freq: Four times a day (QID) | ORAL | 0 refills | Status: DC | PRN

## 2020-03-02 MED ORDER — ONDANSETRON HCL 4 MG PO TABS: 4.0000 mg | ORAL_TABLET | Freq: Four times a day (QID) | ORAL | 0 refills | Status: DC

## 2020-03-02 NOTE — Discharge Instructions (Signed)
Take Zofran for nausea and vomiting. After this you may take 1 Lomotil for the diarrhea Let your stomach rest a bit, then try some sips of water, or broth When you are tolerating liquids then you can advance to a bland diet You may take Tylenol for pain or fever Go to ER if you are worse at any time instead of better

## 2020-03-02 NOTE — ED Triage Notes (Addendum)
Pt in with c/o N/V, generalized abdominal pain, fever Tmax 101 and diarrhea that started this morning around 2 am. States she went out to eat and woke up around 2 am with sxs.  Pt has not had any medication for sxs  Denies sob, cp, runny nose, cough or other uri sxs

## 2020-03-02 NOTE — Telephone Encounter (Signed)
Attempted to reach patient at number provided. There is no answer and no voicemail box set up.

## 2020-03-02 NOTE — ED Provider Notes (Signed)
[facility]    CSN: [id number] Arrival date & time: 03/02/20  1012      History   Chief Complaint Chief Complaint  Patient presents with  . Nausea  . Fever  . Diarrhea  . Abdominal Pain    HPI Cassandra Kim is a 44 y.o. female.   HPI   Nausea, vomiting, diarrhea, abdominal cramping and T MAX 101 since 0200 am.  Thinks it is due to restaurant food eaten after church yesterday. No one else from group got ill.  No respiratory sx.  No exposure to illness.  Is NOT covid vaccinated.  Does not want covid testing.   Is a poorly controlled diabetic.  Did not check her sugar this am. Feels thirsty today and is worried about dehydration.  Vital signs are reassuring.  Is scheduled for hysterectomy if her A1c comes under control.  Past Medical History:  Diagnosis Date  . Abnormal uterine bleeding   . Anemia   . Diabetes mellitus without complication ([place])   . Fibroid   . Obesity     Patient Active Problem List   Diagnosis Date Noted  . Elevated blood pressure reading in office with diagnosis of hypertension 02/19/2020  . Pruritic rash 02/16/2020  . Urine test positive for microalbuminuria 02/12/2020  . Pelvic inflammatory disease 09/03/2019  . Paresthesias 07/14/2019  . Abnormal uterine bleeding 07/06/2019  . Type II diabetes mellitus ([place]) 07/03/2019  . Sleep apnea 05/02/2013    Past Surgical History:  Procedure Laterality Date  . BREAST REDUCTION SURGERY Bilateral   . HYSTEROSCOPY WITH SALPINGOGRAM    . KIDNEY STONE SURGERY    . LAPAROSCOPIC GASTRIC SLEEVE RESECTION    . PANNICULECTOMY      OB History    Gravida  0   Para  0   Term  0   Preterm  0   AB  0   Living  0     SAB  0   TAB  0   Ectopic  0   Multiple  0   Live Births  0            Home Medications    Prior to Admission medications   Medication Sig Start Date End Date Taking? Authorizing Provider  Elagolix-Estrad-Noreth & Elago (ORIAHNN) 300-1-0.5 & 300 MG  CPPK Take 1 capsule by mouth in the morning and at bedtime. 01/31/20  Yes Salvadore Dom, MD  gabapentin (NEURONTIN) 100 MG capsule Take 1 capsule (100 mg total) by mouth at bedtime. 02/12/20  Yes Simmons-Robinson, Makiera, MD  hydrOXYzine (ATARAX/VISTARIL) 25 MG tablet Take 1 tablet (25 mg total) by mouth 3 (three) times daily as needed for itching. 02/12/20  Yes Simmons-Robinson, Riki Sheer, MD  metFORMIN (GLUCOPHAGE-XR) 500 MG 24 hr tablet Take 2 tablets (1,000 mg total) by mouth daily with breakfast. 02/12/20  Yes Simmons-Robinson, Makiera, MD  Blood Glucose Monitoring Suppl w/Device KIT 1 kit by Does not apply route daily. 02/18/20   Autry-Lott, Naaman Plummer, DO  dapagliflozin propanediol (FARXIGA) 10 MG TABS tablet Take 1 tablet (10 mg total) by mouth daily. 02/18/20   Autry-Lott, Naaman Plummer, DO  diphenoxylate-atropine (LOMOTIL) 2.5-0.025 MG tablet Take 1 tablet by mouth 4 (four) times daily as needed for diarrhea or loose stools. 03/02/20   Raylene Everts, MD  FERREX 150 150 MG capsule TAKE 1 CAPSULE BY MOUTH EVERY DAY Patient taking differently: Take 150 mg by mouth daily.  01/27/20   Salvadore Dom, MD  glucose blood  test strip Use as instructed 02/18/20   Autry-Lott, Naaman Plummer, DO  Lancet Devices (LANCING DEVICE) MISC 1 Units by Does not apply route daily. 02/18/20   Autry-Lott, Naaman Plummer, DO  Lancets (ONETOUCH ULTRASOFT) lancets Use as instructed 02/18/20   Autry-Lott, Naaman Plummer, DO  Menthol-Methyl Salicylate (TIGER BALM LINIMENT EX) Apply 1 application topically daily as needed (aches and pain).    [provider]  ondansetron (ZOFRAN) 4 MG tablet Take 1-2 tablets (4-8 mg total) by mouth every 6 (six) hours. As needed nausea 03/02/20   Raylene Everts, MD    Family History Family History  Problem Relation Age of Onset  . Asthma Mother   . Diabetes Mother   . Cancer Sister     Social History Social History   Tobacco Use  . Smoking status: Never Smoker  . Smokeless tobacco:  Never Used  Vaping Use  . Vaping Use: Never used  Substance Use Topics  . Alcohol use: Not Currently  . Drug use: Never     Allergies   Patient has no known allergies.   Review of Systems Review of Systems See HPI  Physical Exam Triage Vital Signs ED Triage Vitals  Enc Vitals Group     BP 03/02/20 1201 (!) 142/85     Pulse Rate 03/02/20 1157 78     Resp 03/02/20 1157 18     Temp 03/02/20 1157 98.6 F (37 C)     Temp Source 03/02/20 1157 Oral     SpO2 03/02/20 1157 98 %     Weight --      Height --      Head Circumference --      Peak Flow --      Pain Score 03/02/20 1155 6     Pain Loc --      Pain Edu? --      Excl. in [place]? --    No data found.  Updated Vital Signs BP (!) 142/85 (BP Location: Right Arm)   Pulse 78   Temp 98.6 F (37 C) (Oral)   Resp 18   SpO2 98%   Physical Exam Constitutional:      General: She is not in acute distress.    Appearance: She is well-developed. She is obese.  HENT:     Head: Normocephalic and atraumatic.     Mouth/Throat:     Mouth: Mucous membranes are moist.  Eyes:     Conjunctiva/sclera: Conjunctivae normal.     Pupils: Pupils are equal, round, and reactive to light.  Cardiovascular:     Rate and Rhythm: Normal rate.  Pulmonary:     Effort: Pulmonary effort is normal. No respiratory distress.  Abdominal:     General: Bowel sounds are normal. There is no distension.     Palpations: Abdomen is soft.     Tenderness: There is abdominal tenderness. There is no guarding or rebound.     Comments: Diffuse tenderness, mild  Musculoskeletal:        General: Normal range of motion.     Cervical back: Normal range of motion.  Skin:    General: Skin is warm and dry.     Comments: No tenting  Neurological:     Mental Status: She is alert.  Psychiatric:        Behavior: Behavior normal.      UC Treatments / Results  Labs (all labs ordered are listed, but only abnormal results are displayed) Labs Reviewed - No data  to  display  EKG   Radiology No results found.  Procedures Procedures (including critical care time)  Medications Ordered in UC Medications - No data to display  Initial Impression / Assessment and Plan / UC Course  I have reviewed the triage vital signs and the nursing notes.  Pertinent labs & imaging results that were available during my care of the patient were reviewed by me and considered in my medical decision making (see chart for details).    Discussed with patient that she likely has a stomach flu.  Not appropriate to do GI pathogen panel after less than 24 hours of symptoms.  Reviewed prevention of dehydration.  Fluid intake.  Watching blood sugar carefully while symptomatic.  Reasons for going to the ER, if worse instead of better or for hypoglycemia  Final Clinical Impressions(s) / UC Diagnoses   Final diagnoses:  Gastroenteritis     Discharge Instructions     Take Zofran for nausea and vomiting. After this you may take 1 Lomotil for the diarrhea Let your stomach rest a bit, then try some sips of water, or broth When you are tolerating liquids then you can advance to a bland diet You may take Tylenol for pain or fever Go to ER if you are worse at any time instead of better   ED Prescriptions    Medication Sig Dispense Auth. Provider   diphenoxylate-atropine (LOMOTIL) 2.5-0.025 MG tablet Take 1 tablet by mouth 4 (four) times daily as needed for diarrhea or loose stools. 10 tablet Raylene Everts, MD   ondansetron (ZOFRAN) 4 MG tablet Take 1-2 tablets (4-8 mg total) by mouth every 6 (six) hours. As needed nausea 12 tablet Raylene Everts, MD     PDMP not reviewed this encounter.   Raylene Everts, MD 03/02/20 [phone]

## 2020-03-02 NOTE — Telephone Encounter (Signed)
Patient is calling to discuss surgery appointments.

## 2020-03-03 ENCOUNTER — Ambulatory Visit: Payer: BC Managed Care – PPO | Admitting: Obstetrics and Gynecology

## 2020-03-04 ENCOUNTER — Telehealth: Payer: Self-pay

## 2020-03-04 NOTE — Telephone Encounter (Signed)
Patient is returning a call to office. No open telephone message.

## 2020-03-05 NOTE — Telephone Encounter (Signed)
Attempted to reach patient at number provided. There is no answer and no voicemail box set up.  Surgery scheduled for 04/28/2020 at 0730 at [facility]. Patient is was unable to schedule other appointments and review surgery instructions, but will call back to discuss.

## 2020-03-06 ENCOUNTER — Other Ambulatory Visit: Payer: Self-pay | Admitting: Family Medicine

## 2020-03-16 ENCOUNTER — Ambulatory Visit: Payer: BC Managed Care – PPO | Admitting: Family Medicine

## 2020-03-17 ENCOUNTER — Other Ambulatory Visit: Payer: Self-pay

## 2020-03-17 ENCOUNTER — Encounter: Payer: Self-pay | Admitting: Family Medicine

## 2020-03-17 ENCOUNTER — Ambulatory Visit (INDEPENDENT_AMBULATORY_CARE_PROVIDER_SITE_OTHER): Payer: BC Managed Care – PPO | Admitting: Family Medicine

## 2020-03-17 VITALS — BP 130/78 | HR 75 | Wt 247.0 lb

## 2020-03-17 DIAGNOSIS — E119 Type 2 diabetes mellitus without complications: Secondary | ICD-10-CM

## 2020-03-17 LAB — POCT GLYCOSYLATED HEMOGLOBIN (HGB A1C): Hemoglobin A1C: 8.5 % — AB (ref 4.0–5.6)

## 2020-03-17 MED ORDER — METFORMIN HCL ER 500 MG PO TB24: 1000.0000 mg | ORAL_TABLET | Freq: Two times a day (BID) | ORAL | 3 refills | Status: DC

## 2020-03-17 NOTE — Progress Notes (Signed)
    SUBJECTIVE:   CHIEF COMPLAINT / HPI:   Type 2 diabetes management Patient is a 44 year old female that presents today for medication management for type 2 diabetes. She continues on metformin 1000 in the morning and farxiga 10mg  per day. She has been seeing fasting glucose numbers in the 130s to 150s in the morning. The highest glucose she has seen was low 200s right after. She does experience some numbness and tingling in her feet.  PERTINENT  PMH / PSH: History of type 2 diabetes  OBJECTIVE:   BP 130/78   Pulse 75   Wt 247 lb (112 kg)   SpO2 98%   BMI 43.75 kg/m    General: NAD, pleasant, able to participate in exam Cardiac: RRR, no murmurs. Respiratory: CTAB, normal effort Abdomen: Bowel sounds present, nontender Extremities: no edema or cyanosis.  ASSESSMENT/PLAN:   Type II diabetes mellitus [facility]) Assessment: 44 year old female with type 2 diabetes which is becoming better controlled.  Previous A1c of 11.8, A1c today of 8.5.  Patient continues on Metformin 1000 mg in the morning and [place] daily. Plan: -Congratulated patient on the great work with her improvement of A1c. -Using shared decision-making with the patient we did decide that she would benefit from increasing her Metformin to 1000 mg twice per day to try to get her A1c down below 7 as a goal -Sent in refill for patient's metformin -Patient will continue her Wilder Glade -Follow-up in 3 months.     Lurline Del, [place]    This note was prepared using Dragon voice recognition software and may include unintentional dictation errors due to the inherent limitations of voice recognition software.

## 2020-03-17 NOTE — Patient Instructions (Addendum)
It was great to see you!  Our plans for today:  -Congratulations on the hard work! Your A1c has improved from 11.8 to 8.5 today.  As discussed we will increase your Metformin to 1000 mg twice per day.  I recommend that you continue working on diet and exercise. -You should come in for a follow-up to recheck your A1c in about 3 months.  I did send you a refill as discussed for your Metformin.  Continue taking your Wilder Glade as well.  Take care and seek immediate care sooner if you develop any concerns.   Dr. [facility]

## 2020-03-17 NOTE — Assessment & Plan Note (Signed)
Assessment: 44 year old female with type 2 diabetes which is becoming better controlled.  Previous A1c of 11.8, A1c today of 8.5.  Patient continues on Metformin 1000 mg in the morning and [place] daily. Plan: -Congratulated patient on the great work with her improvement of A1c. -Using shared decision-making with the patient we did decide that she would benefit from increasing her Metformin to 1000 mg twice per day to try to get her A1c down below 7 as a goal -Sent in refill for patient's metformin -Patient will continue her Wilder Glade -Follow-up in 3 months.

## 2020-03-19 ENCOUNTER — Telehealth: Payer: Self-pay | Admitting: *Deleted

## 2020-03-19 NOTE — Telephone Encounter (Signed)
Spoke with patient. Surgery date request conformed.  Advised surgery is scheduled for Tuesday, 04/28/20 at 0730 at [facility].  Surgery instruction sheet reviewed with hospital brochure. Patient will be provided copies during her pre-op visit on 04/07/20.   Patient advised of Covid screening and quarantine requirements and agreeable.   Routing to provider for final review. Patient is agreeable to disposition. Will close encounter.

## 2020-04-07 ENCOUNTER — Telehealth: Payer: Self-pay | Admitting: *Deleted

## 2020-04-07 ENCOUNTER — Ambulatory Visit (INDEPENDENT_AMBULATORY_CARE_PROVIDER_SITE_OTHER): Payer: BC Managed Care – PPO | Admitting: Obstetrics and Gynecology

## 2020-04-07 ENCOUNTER — Encounter: Payer: Self-pay | Admitting: Obstetrics and Gynecology

## 2020-04-07 ENCOUNTER — Other Ambulatory Visit: Payer: Self-pay

## 2020-04-07 VITALS — BP 122/74 | HR 86 | Resp 14 | Ht 63.0 in | Wt 249.0 lb

## 2020-04-07 DIAGNOSIS — Z8639 Personal history of other endocrine, nutritional and metabolic disease: Secondary | ICD-10-CM

## 2020-04-07 DIAGNOSIS — Z01818 Encounter for other preprocedural examination: Secondary | ICD-10-CM

## 2020-04-07 DIAGNOSIS — D259 Leiomyoma of uterus, unspecified: Secondary | ICD-10-CM

## 2020-04-07 NOTE — Progress Notes (Signed)
GYNECOLOGY  VISIT   HPI: 44 y.o.   Single Black or African American Not Hispanic or Latino  female   [id number] with No LMP recorded. (Menstrual status: Irregular Periods).   here for   Pre op for hysterectomy. She has a h/o a symptomatic fibroid uterus with AUB leading to anemia. Prior h/o uterine artery embolization.   She was originally seen in 4/21 c/o 6 months of continuous bleeding, her hgb was 8.2, ferritin was 6. Treated with iron transfusion.  Her exam was concerning for PID and she was treated initially as an outpatient and was then admitted for IV antibiotics.  Her endometrial biopsy was c/w endometritis, +GBS. Negative GC/CT/Trich Pap 09/12/19: negative, negative HPV.  Ultrasound from 07/19/19: fibroid uterus. Overall uterine measurement 16.1 x 8.7 x 9.9 cm. 7.6 x 6.9 x 8 cm mass in the upper uterus c/w fibroid. Extends transmural and obscuring the endometrium.  She has been on Oriahnn for 6 months to control her bleeding and try to decrease the size of her uterus. She bleeds daily, can be spotting to light bleeding.   Her hysterectomy was originally scheduled for 02/03/20, it was cancelled because her preoperative HGBA1C returned at 11.8. Since then she has been working hard on her diabetes control. Her last HGBA1C was 8.5 3 weeks ago.  She has been exercising and has been eating better. Taking her medication regularly, on metformin and they added [place].  GYNECOLOGIC HISTORY: No LMP recorded. (Menstrual status: Irregular Periods). Contraception:none  Menopausal hormone therapy: none        OB History    Gravida  0   Para  0   Term  0   Preterm  0   AB  0   Living  0     SAB  0   TAB  0   Ectopic  0   Multiple  0   Live Births  0              Patient Active Problem List   Diagnosis Date Noted  . Elevated blood pressure reading in office with diagnosis of hypertension 02/19/2020  . Pruritic rash 02/16/2020  . Urine test positive for microalbuminuria  02/12/2020  . Pelvic inflammatory disease 09/03/2019  . Paresthesias 07/14/2019  . Abnormal uterine bleeding 07/06/2019  . Type II diabetes mellitus ([place]) 07/03/2019  . Sleep apnea 05/02/2013  Not on CPAP.   Past Medical History:  Diagnosis Date  . Abnormal uterine bleeding   . Anemia   . Diabetes mellitus without complication ([place])   . Fibroid   . Obesity     Past Surgical History:  Procedure Laterality Date  . BREAST REDUCTION SURGERY Bilateral   . HYSTEROSCOPY WITH SALPINGOGRAM    . KIDNEY STONE SURGERY    . LAPAROSCOPIC GASTRIC SLEEVE RESECTION    . PANNICULECTOMY      Current Outpatient Medications  Medication Sig Dispense Refill  . Blood Glucose Monitoring Suppl w/Device KIT 1 kit by Does not apply route daily. 1 kit 0  . dapagliflozin propanediol (FARXIGA) 10 MG TABS tablet Take 1 tablet (10 mg total) by mouth daily. 30 tablet 2  . Elagolix-Estrad-Noreth & Elago (ORIAHNN) 300-1-0.5 & 300 MG CPPK Take 1 capsule by mouth in the morning and at bedtime. 56 each 2  . FERREX 150 150 MG capsule TAKE 1 CAPSULE BY MOUTH EVERY DAY (Patient taking differently: Take 150 mg by mouth daily. ) 90 capsule 1  . gabapentin (NEURONTIN) 100 MG capsule Take  1 capsule (100 mg total) by mouth at bedtime. 90 capsule 0  . glucose blood test strip Use as instructed 100 each 12  . hydrOXYzine (ATARAX/VISTARIL) 25 MG tablet Take 1 tablet (25 mg total) by mouth 3 (three) times daily as needed for itching. 90 tablet 0  . Lancet Devices (LANCING DEVICE) MISC 1 Units by Does not apply route daily. 100 each 0  . Lancets (ONETOUCH ULTRASOFT) lancets Use as instructed 100 each 12  . Menthol-Methyl Salicylate (TIGER BALM LINIMENT EX) Apply 1 application topically daily as needed (aches and pain).    . metFORMIN (GLUCOPHAGE-XR) 500 MG 24 hr tablet Take 2 tablets (1,000 mg total) by mouth in the morning and at bedtime. 120 tablet 3   No current facility-administered medications for this visit.      ALLERGIES: Patient has no known allergies.  Family History  Problem Relation Age of Onset  . Asthma Mother   . Diabetes Mother   . Cancer Sister     Social History   Socioeconomic History  . Marital status: Single    Spouse name: Not on file  . Number of children: Not on file  . Years of education: Not on file  . Highest education level: Not on file  Occupational History  . Not on file  Tobacco Use  . Smoking status: Never Smoker  . Smokeless tobacco: Never Used  Vaping Use  . Vaping Use: Never used  Substance and Sexual Activity  . Alcohol use: Not Currently  . Drug use: Never  . Sexual activity: Not Currently    Partners: Female    Birth control/protection: Abstinence    Comment: last SA x9 years ago per patient  Other Topics Concern  . Not on file  Social History Narrative  . Not on file   Social Determinants of Health   Financial Resource Strain:   . Difficulty of Paying Living Expenses: Not on file  Food Insecurity:   . Worried About Charity fundraiser in the Last Year: Not on file  . Ran Out of Food in the Last Year: Not on file  Transportation Needs:   . Lack of Transportation (Medical): Not on file  . Lack of Transportation (Non-Medical): Not on file  Physical Activity:   . Days of Exercise per Week: Not on file  . Minutes of Exercise per Session: Not on file  Stress:   . Feeling of Stress : Not on file  Social Connections:   . Frequency of Communication with Friends and Family: Not on file  . Frequency of Social Gatherings with Friends and Family: Not on file  . Attends Religious Services: Not on file  . Active Member of Clubs or Organizations: Not on file  . Attends Archivist Meetings: Not on file  . Marital Status: Not on file  Intimate Partner Violence:   . Fear of Current or Ex-Partner: Not on file  . Emotionally Abused: Not on file  . Physically Abused: Not on file  . Sexually Abused: Not on file    Review of Systems  All  other systems reviewed and are negative.   PHYSICAL EXAMINATION:    BP 122/74 (BP Location: Right Arm, Patient Position: Sitting, Cuff Size: Large)   Pulse 86   Resp 14   Ht $R'5\' 3"'pq$  (1.6 m)   Wt 249 lb (112.9 kg)   BMI 44.11 kg/m     General appearance: alert, cooperative and appears stated age Neck: no adenopathy, supple,  symmetrical, trachea midline and thyroid normal to inspection and palpation Heart: regular rate and rhythm Lungs: CTAB Abdomen: soft, obese, tender in her lower abdomen. Uterus not clearly felt on exam today (limited secondary to tenderness).  Extremities: normal, atraumatic, no cyanosis Skin: normal color, texture and turgor, no rashes or lesions Lymph: normal cervical supraclavicular and inguinal nodes Neurologic: grossly normal   ASSESSMENT Symptomatic fibroid uterus, AUB leading to anemia.  H/O PID On Oriahnn with improvement in bleeding and pain. Exam is limited with BMI, but I suspect her uterus is smaller H/O DM H/O Sleep apnea    PLAN Discussed total laparoscopic hysterectomy, bilateral salpingectomies and cystoscopy. Reviewed the risks of the procedure, including infection, bleeding, damage to bowel/badder/vessels/ureters.  Discussed the possible need for laparotomy. Discussed post operative recovery . All of her questions were answered

## 2020-04-07 NOTE — Telephone Encounter (Signed)
Spoke with patient while in office.  Patient is scheduled for surgery on 04/28/20. Patient request to r/s Covid pre-op testing to a later time to accommodate her work schedule.    Advised patient of pre-op Covid 19 screening and quarantine requirements.   Patient states she is going to review options with her employer on 12/8. Patient is requesting a return call on 12/8 after 3pm at [phone] to further discuss. Will keep surgery as scheduled until reviewed with employer.

## 2020-04-08 LAB — COMPREHENSIVE METABOLIC PANEL
ALT: 22 IU/L (ref 0–32)
AST: 17 IU/L (ref 0–40)
Albumin/Globulin Ratio: 1.5 (ref 1.2–2.2)
Albumin: 4.2 g/dL (ref 3.8–4.8)
Alkaline Phosphatase: 110 IU/L (ref 44–121)
BUN/Creatinine Ratio: 30 — ABNORMAL HIGH (ref 9–23)
BUN: 19 mg/dL (ref 6–24)
Bilirubin Total: <0.2 mg/dL (ref 0.0–1.2)
CO2: 26 mmol/L (ref 20–29)
Calcium: 9.6 mg/dL (ref 8.7–10.2)
Chloride: 102 mmol/L (ref 96–106)
Creatinine, Ser: 0.63 mg/dL (ref 0.57–1.00)
GFR calc Af Amer: 126 mL/min/{1.73_m2} (ref >59)
GFR calc non Af Amer: 109 mL/min/{1.73_m2} (ref >59)
Globulin, Total: 2.8 g/dL (ref 1.5–4.5)
Glucose: 142 mg/dL — ABNORMAL HIGH (ref 65–99)
Potassium: 4.3 mmol/L (ref 3.5–5.2)
Sodium: 139 mmol/L (ref 134–144)
Total Protein: 7 g/dL (ref 6.0–8.5)

## 2020-04-08 LAB — CBC
Hematocrit: 40.8 % (ref 34.0–46.6)
Hemoglobin: 13.3 g/dL (ref 11.1–15.9)
MCH: 25.7 pg — ABNORMAL LOW (ref 26.6–33.0)
MCHC: 32.6 g/dL (ref 31.5–35.7)
MCV: 79 fL (ref 79–97)
Platelets: 267 10*3/uL (ref 150–450)
RBC: 5.17 x10E6/uL (ref 3.77–5.28)
RDW: 15.6 % — ABNORMAL HIGH (ref 11.7–15.4)
WBC: 6.7 10*3/uL (ref 3.4–10.8)

## 2020-04-08 NOTE — Telephone Encounter (Signed)
Call to patient x2. No answer. Voicemail not set up, unable to leave message.

## 2020-04-09 LAB — HEMOGLOBIN A1C
Est. average glucose Bld gHb Est-mCnc: 194 mg/dL
Hgb A1c MFr Bld: 8.4 % — ABNORMAL HIGH (ref 4.8–5.6)

## 2020-04-13 NOTE — Telephone Encounter (Signed)
Call placed to mobile number, no answer, voicemail.  Call placed to home number on file, spoke with "Horris Latino", Left message to call Sharee Pimple at [place].

## 2020-04-14 NOTE — Patient Instructions (Signed)
YOU ARE SCHEDULED FOR A COVID TEST ___12/27______@____________ . THIS TEST MUST BE DONE BEFORE SURGERY. GO TO  [place]. [place], [place], IT IS APPROXIMATELY 2 MINUTES PAST [employer] ON THE RIGHT AND REMAIN IN YOUR CAR, THIS IS A DRIVE UP TEST. ONCE YOUR COVID TEST IS DONE PLEASE FOLLOW ALL THE QUARANTINE  INSTRUCTIONS GIVEN IN YOUR HANDOUT.      Your procedure is scheduled on  04/28/20   Report to [facility] AT  5:30  A. M.   Call this number if you have problems the morning of surgery  :[phone].   OUR ADDRESS IS [address].  WE ARE LOCATED IN [facility].  PLEASE BRING YOUR INSURANCE CARD AND PHOTO ID DAY OF SURGERY.  ONLY ONE PERSON ALLOWED IN FACILITY WAITING AREA.                                     REMEMBER:  DO NOT EAT FOOD AFTER MIDNIGHT .  You may have clear liquid until 4:30 AM.    YOU MAY  BRUSH YOUR TEETH MORNING OF SURGERY AND RINSE YOUR MOUTH OUT, NO CHEWING GUM CANDY OR MINTS.   TAKE THESE MEDICATIONS MORNING OF SURGERY WITH A SIP OF WATER:  ___None  ONE VISITOR IS ALLOWED IN WAITING ROOM ONLY DAY OF SURGERY.   NO VISITOR MAY SPEND THE NIGHT.  VISITOR ARE ALLOWED TO STAY UNTIL 8:00 PM.                                    DO NOT WEAR JEWERLY, MAKE UP, OR NAIL POLISH ON FINGERNAILS.  DO NOT WEAR LOTIONS, POWDERS, PERFUMES OR DEODORANT.  DO NOT SHAVE FOR 24 HOURS PRIOR TO DAY OF SURGERY   CONTACTS, GLASSES, OR DENTURES MAY NOT BE WORN TO SURGERY.                                    [facility] IS NOT RESPONSIBLE  FOR ANY BELONGINGS.                                                                    Marland Kitchen     How to Manage Your Diabetes Before and After Surgery  Why is it important to control my blood sugar before and after surgery? . Improving blood sugar levels before and after surgery helps healing and can limit problems. . A way of improving blood sugar control is eating a healthy diet by: o   Eating less sugar and carbohydrates o  Increasing activity/exercise o  Talking with your doctor about reaching your blood sugar goals . High blood sugars (greater than 180 mg/dL) can raise your risk of infections and slow your recovery, so you will need to focus on controlling your diabetes during the weeks before surgery. . Make sure that the doctor who takes care of your diabetes knows about your planned surgery including the date and location.  How do I manage my blood sugar  before surgery? . Check your blood sugar at least 4 times a day, starting 2 days before surgery, to make sure that the level is not too high or low. o Check your blood sugar the morning of your surgery when you wake up and every 2 hours until you get to the Short Stay unit. . If your blood sugar is less than 70 mg/dL, you will need to treat for low blood sugar: o Do not take insulin. o Treat a low blood sugar (less than 70 mg/dL) with  cup of clear juice (cranberry or apple), 4 glucose tablets, OR glucose gel. o Recheck blood sugar in 15 minutes after treatment (to make sure it is greater than 70 mg/dL). If your blood sugar is not greater than 70 mg/dL on recheck, call [phone] for further instructions. . Report your blood sugar to the short stay nurse when you get to Short Stay.  . If you are admitted to the hospital after surgery: o Your blood sugar will be checked by the staff and you will probably be given insulin after surgery (instead of oral diabetes medicines) to make sure you have good blood sugar levels. o The goal for blood sugar control after surgery is 80-180 mg/dL.   WHAT DO I DO ABOUT MY DIABETES MEDICATION?  Marland Kitchen Do not take oral diabetes medicines (pills) the morning of surgery.                                                                                              [facility] - Preparing for Surgery Before surgery, you can play an important role .  Because skin is not sterile, your skin  needs to be as free of germs as possible.   You can reduce the number of germs on your skin by washing with CHG (chlorahexidine gluconate) soap before surgery.   CHG is an antiseptic cleaner which kills germs and bonds with the skin to continue killing germs even after washing. Please DO NOT use if you have an allergy to CHG or antibacterial soaps.   If your skin becomes reddened/irritated stop using the CHG and inform your nurse when you arrive at Short Stay. Do not shave (including legs and underarms) for at least 48 hours prior to the first CHG shower.    Please follow these instructions carefully:  1.  Shower with CHG Soap the night before surgery and the  morning of Surgery.  2.  If you choose to wash your hair, wash your hair first as usual with your  normal  shampoo.  3.  After you shampoo, rinse your hair and body thoroughly to remove the  shampoo.                                        4.  Use CHG as you would any other liquid soap.  You can apply chg directly  to the skin and wash  Gently with a scrungie or clean washcloth.  5.  Apply the CHG Soap to your body ONLY FROM THE NECK DOWN.   Do not use on face/ open                           Wound or open sores. Avoid contact with eyes, ears mouth and genitals (private parts).                       Wash face,  Genitals (private parts) with your normal soap.             6.  Wash thoroughly, paying special attention to the area where your surgery  will be performed.  7.  Thoroughly rinse your body with warm water from the neck down.  8.  DO NOT shower/wash with your normal soap after using and rinsing off  the CHG Soap.             9.  Pat yourself dry with a clean towel.            10.  Wear clean pajamas.            11.  Place clean sheets on your bed the night of your first shower and do not  sleep with pets. Day of Surgery : Do not apply any lotions/deodorants the morning of surgery.  Please wear clean clothes to  the hospital/surgery center.  FAILURE TO FOLLOW THESE INSTRUCTIONS MAY RESULT IN THE CANCELLATION OF YOUR SURGERY PATIENT SIGNATURE_________________________________  NURSE SIGNATURE__________________________________  ________________________________________________________________________

## 2020-04-16 ENCOUNTER — Encounter (HOSPITAL_COMMUNITY)
Admission: RE | Admit: 2020-04-16 | Discharge: 2020-04-16 | Disposition: A | Discharge status: Home / Self Care | Payer: BC Managed Care – PPO | Source: Ambulatory Visit | Attending: Family Medicine | Admitting: Family Medicine

## 2020-04-16 NOTE — Telephone Encounter (Signed)
[facility] pre surgery testing center called to let Dr Talbert Nan know patient did not show for pre surgery testing. Called number listed and was told she was working.

## 2020-04-16 NOTE — Telephone Encounter (Signed)
Call to patient to f/u on previous conversation regarding pre-op Covid 19 testing. No answer, no voicemail set up.

## 2020-04-16 NOTE — Progress Notes (Signed)
Pt did not come to the PAT appointment. I called her contact numbers and was told that she was at work. Dr's office was called.

## 2020-04-17 NOTE — Telephone Encounter (Signed)
Call placed to [facility] PAT, left message advising I received message regarding pre-op appt, I have reached out to patient, no return call.  Return call to Sharee Pimple, Therapist, sports at [facility].

## 2020-04-17 NOTE — Telephone Encounter (Signed)
Message received from [place], [facility] PAT. No return call from patient. They attempted to contact patient on 12/16 and again on 12/17, no return call. They do have an available appt on 12/20 and 12/22.

## 2020-04-20 NOTE — Telephone Encounter (Signed)
Call to patient on mobile number, no answer, no voicemail set up.   Reviewed dpr, call placed to [facility], left message for Rasheena to call Sharee Pimple, RN at [facility].   Call placed to home number, spoke with Colletta [place], Left message to call Sharee Pimple, RN at [facility].

## 2020-04-20 NOTE — Telephone Encounter (Signed)
Per review of Epic, patient is scheduled for pre-op visit on 12/23 at [facility] at [facility].

## 2020-04-20 NOTE — Telephone Encounter (Signed)
Late entry: Shamika called from [facility], patient has not returned call to schedule pre-op visit. Advised I will reach out to the patient again today, no return call from patient to date. If no return call, will review with provider.

## 2020-04-21 NOTE — Telephone Encounter (Signed)
Spoke with patient.  Patient plans to proceed with surgery as scheduled on 04/28/20.  PAT appt was r/s to 12/23 at 9am.  Will keep Covid 19 screening as scheduled for 12/23 at 2:55pm.  Questions answered. Patient verbalizes understanding and is agreeable.  Routing to provider for final review. Patient is agreeable to disposition. Will close encounter.

## 2020-04-23 ENCOUNTER — Encounter (HOSPITAL_COMMUNITY)
Admission: RE | Admit: 2020-04-23 | Discharge: 2020-04-23 | Disposition: A | Discharge status: Home / Self Care | Payer: BC Managed Care – PPO | Source: Ambulatory Visit | Attending: Obstetrics and Gynecology | Admitting: Obstetrics and Gynecology

## 2020-04-23 ENCOUNTER — Other Ambulatory Visit (HOSPITAL_COMMUNITY): Payer: BC Managed Care – PPO

## 2020-04-23 ENCOUNTER — Other Ambulatory Visit: Payer: Self-pay

## 2020-04-23 ENCOUNTER — Encounter (HOSPITAL_COMMUNITY): Payer: Self-pay

## 2020-04-23 DIAGNOSIS — Z01812 Encounter for preprocedural laboratory examination: Secondary | ICD-10-CM | POA: Diagnosis present

## 2020-04-23 HISTORY — DX: Gastro-esophageal reflux disease without esophagitis: K21.9

## 2020-04-23 LAB — COMPREHENSIVE METABOLIC PANEL
ALT: 28 U/L (ref 0–44)
AST: 19 U/L (ref 15–41)
Albumin: 3.2 g/dL — ABNORMAL LOW (ref 3.5–5.0)
Alkaline Phosphatase: 57 U/L (ref 38–126)
Anion gap: 8 (ref 5–15)
BUN: 10 mg/dL (ref 6–20)
CO2: 26 mmol/L (ref 22–32)
Calcium: 8.4 mg/dL — ABNORMAL LOW (ref 8.9–10.3)
Chloride: 102 mmol/L (ref 98–111)
Creatinine, Ser: 0.46 mg/dL (ref 0.44–1.00)
GFR, Estimated: >60 mL/min (ref >60)
Glucose, Bld: 133 mg/dL — ABNORMAL HIGH (ref 70–99)
Potassium: 3.9 mmol/L (ref 3.5–5.1)
Sodium: 136 mmol/L (ref 135–145)
Total Bilirubin: 0.3 mg/dL (ref 0.3–1.2)
Total Protein: 6.3 g/dL — ABNORMAL LOW (ref 6.5–8.1)

## 2020-04-23 LAB — CBC
HCT: 43.4 % (ref 36.0–46.0)
Hemoglobin: 13.9 g/dL (ref 12.0–15.0)
MCH: 25.5 pg — ABNORMAL LOW (ref 26.0–34.0)
MCHC: 32 g/dL (ref 30.0–36.0)
MCV: 79.6 fL — ABNORMAL LOW (ref 80.0–100.0)
Platelets: 225 10*3/uL (ref 150–400)
RBC: 5.45 MIL/uL — ABNORMAL HIGH (ref 3.87–5.11)
RDW: 14.7 % (ref 11.5–15.5)
WBC: 5.9 10*3/uL (ref 4.0–10.5)
nRBC: 0 % (ref 0.0–0.2)

## 2020-04-23 LAB — GLUCOSE, CAPILLARY: Glucose-Capillary: 138 mg/dL — ABNORMAL HIGH (ref 70–99)

## 2020-04-23 NOTE — Progress Notes (Signed)
Anesthesia Review:  [facility]  Cardiologist : Chest x-ray : EKG :04/23/2020  Echo : Stress test: Cardiac Cath :  Activity level: can do a flight of stairs without difficulty  Sleep Study/ CPAP : Fasting Blood Sugar :      / Checks Blood Sugar -- times a day:   Blood Thinner/ Instructions /Last Dose: ASA / Instructions/ Last Dose :  Glucose checks twice daily per pt  hgba1c done 04/08/2020-8.4 in epic

## 2020-04-23 NOTE — Progress Notes (Signed)
Cassandra Kim  04/23/2020      Your procedure is scheduled on  04/28/2020   Report to [facility]  at     [place].  Call this number if you have problems the morning of surgery:[phone]  OUR ADDRESS IS [address], WE ARE [facility]ATED [facility].   Remember:  Do not eat food  after midnight. May have clear liquids from 62midnite until 0430am of surgery then nothing by mouthl  Complete G2 drink by 0430am of surgery.    Take these medicines the morning of surgery with A SIP OF WATER Hold Farxiga day before surgery.  None day of surgery .   No diabetic meds am of surgery.    Do not wear jewelry, make-up or nail polish.  Do not wear lotions, powders, or perfumes, or deoderant.  Do not shave 48 hours prior to surgery.  Men may shave face and neck.  Do not bring valuables to the hospital.  [facility] is not responsible for any belongings or valuables.  Contacts, dentures or bridgework may not be worn into surgery.  Leave your suitcase in the car.  After surgery it may be brought to your room.  For patients admitted to the hospital, discharge time will be determined by your treatment team.  Patients discharged the day of surgery will not be allowed to drive home.      Please read over the following fact sheets that you were given: [facility] - Preparing for Surgery Before surgery, you can play an important role.  Because skin is not sterile, your skin needs to be as free of germs as possible.  You can reduce the number of germs on your skin by washing with CHG (chlorahexidine gluconate) soap before surgery.  CHG is an antiseptic cleaner which kills germs and bonds with the skin to continue killing germs even after washing. Please DO NOT use if you have an allergy to CHG or antibacterial soaps.  If your skin becomes reddened/irritated stop using the CHG and inform your nurse when you arrive at Short Stay. Do not shave  (including legs and underarms) for at least 48 hours prior to the first CHG shower.  You may shave your face/neck. Please follow these instructions carefully:  1.  Shower with CHG Soap the night before surgery and the  morning of Surgery.  2.  If you choose to wash your hair, wash your hair first as usual with your  normal  shampoo.  3.  After you shampoo, rinse your hair and body thoroughly to remove the  shampoo.                           4.  Use CHG as you would any other liquid soap.  You can apply chg directly  to the skin and wash                       Gently with a scrungie or clean washcloth.  5.  Apply the CHG Soap to your body ONLY FROM THE NECK DOWN.   Do not use on face/ open                           Wound or open sores. Avoid contact with eyes, ears mouth and genitals (private parts).  Wash face,  Genitals (private parts) with your normal soap.             6.  Wash thoroughly, paying special attention to the area where your surgery  will be performed.  7.  Thoroughly rinse your body with warm water from the neck down.  8.  DO NOT shower/wash with your normal soap after using and rinsing off  the CHG Soap.                9.  Pat yourself dry with a clean towel.            10.  Wear clean pajamas.            11.  Place clean sheets on your bed the night of your first shower and do not  sleep with pets. Day of Surgery : Do not apply any lotions/deodorants the morning of surgery.  Please wear clean clothes to the hospital/surgery center.  FAILURE TO FOLLOW THESE INSTRUCTIONS MAY RESULT IN THE CANCELLATION OF YOUR SURGERY PATIENT SIGNATURE_________________________________  NURSE SIGNATURE__________________________________  ________________________________________________________________________

## 2020-04-27 ENCOUNTER — Encounter (HOSPITAL_COMMUNITY): Payer: Self-pay | Admitting: Physician Assistant

## 2020-04-27 ENCOUNTER — Telehealth: Payer: Self-pay | Admitting: *Deleted

## 2020-04-27 ENCOUNTER — Other Ambulatory Visit (HOSPITAL_COMMUNITY)
Admission: RE | Admit: 2020-04-27 | Discharge: 2020-04-27 | Disposition: A | Discharge status: Home / Self Care | Payer: BC Managed Care – PPO | Source: Ambulatory Visit | Attending: Obstetrics and Gynecology | Admitting: Obstetrics and Gynecology

## 2020-04-27 DIAGNOSIS — Z01812 Encounter for preprocedural laboratory examination: Secondary | ICD-10-CM | POA: Insufficient documentation

## 2020-04-27 DIAGNOSIS — R9431 Abnormal electrocardiogram [ECG] [EKG]: Secondary | ICD-10-CM

## 2020-04-27 DIAGNOSIS — Z20822 Contact with and (suspected) exposure to covid-19: Secondary | ICD-10-CM | POA: Insufficient documentation

## 2020-04-27 LAB — SARS CORONAVIRUS 2 (TAT 6-24 HRS): SARS Coronavirus 2: NEGATIVE

## 2020-04-27 NOTE — Telephone Encounter (Signed)
Reviewed with Dr. Oscar La, if no previous EKG, cancel surgery, referral to cardiology for evaluation.   Call placed to PCP on file, spoke with Page. No additional EKG on file, will review with PCP and return call if any additional information.    Spoke with patient, confirmed last EKG approximately greater than 10 years ago, has never seen cardiologist.  Advised patient will need to cancel surgery for 04/28/20, will need referral to cardiology for further evaluation. Patient verbalizes understanding and is agreeable to plan.   Urgent referral placed to cardiology for abnormal EKG.   Call placed to central scheduling, spoke with Baird Lyons, surgery cancelled.   Routing to provider for final review. Patient is agreeable to disposition. Will close encounter.  Cc: Hayley Carder

## 2020-04-27 NOTE — Telephone Encounter (Signed)
Leda Min, RN  04/27/2020 7:54 AM EST Back to Top     Call placed to [facility] pre-op, left message requesting anesthesia review 04/23/20 EKG results, patient is scheduled for surgery on 12/28. Return call to Noreene Larsson, Charity fundraiser at [facility].    Romualdo Bolk, MD  04/23/2020 10:09 AM EST      Please have anesthesia review her EKG, it is out of my expertise.     Message received from [facility], pre-op nurse at [facility].  04/23/20 EKG reviewed by Jodell Cipro, PA.  Was advised without previous EKG to compare to, unable to determine if interior infarct is new vs chronic, should be evaluated by cardiologist.   Routing to Dr. Oscar La.

## 2020-04-28 ENCOUNTER — Ambulatory Visit (HOSPITAL_BASED_OUTPATIENT_CLINIC_OR_DEPARTMENT_OTHER)
Admission: RE | Admit: 2020-04-28 | Payer: BC Managed Care – PPO | Source: Home / Self Care | Admitting: Obstetrics and Gynecology

## 2020-04-28 LAB — TYPE AND SCREEN
ABO/RH(D): O POS
Antibody Screen: NEGATIVE

## 2020-04-28 SURGERY — HYSTERECTOMY, TOTAL, LAPAROSCOPIC, WITH SALPINGECTOMY
Anesthesia: General

## 2020-04-29 ENCOUNTER — Telehealth: Payer: Self-pay

## 2020-04-29 NOTE — Telephone Encounter (Signed)
Call to patient, no answer, voicemail not set up.   Per review of Epic, [facility] [facility]  LM for patient on 12/28 to schedule.

## 2020-04-29 NOTE — Telephone Encounter (Signed)
Patient states she needs to speak with nurse regarding surgery cancellation. Patient would like cardiology referral information.

## 2020-05-05 ENCOUNTER — Ambulatory Visit: Payer: Self-pay | Admitting: Obstetrics and Gynecology

## 2020-05-05 ENCOUNTER — Ambulatory Visit: Payer: BC Managed Care – PPO | Admitting: Cardiology

## 2020-05-05 NOTE — Telephone Encounter (Signed)
Spoke with patient.  Patient is aware of appt with Cardiology on 05/13/20.  Patient will f/u with office once evaluation is complete.  Questions answered.  Routing to provider for final review. Patient is agreeable to disposition. Will close encounter. Cc: Hayley Carder

## 2020-05-05 NOTE — Telephone Encounter (Signed)
Please make sure that she stays on her Milinda Pointer until surgery.

## 2020-05-06 NOTE — Telephone Encounter (Signed)
Call to patient. No answer, no voicemail.  MyChart is now active, MyChart message sent to pt with recommendations.

## 2020-05-10 NOTE — Progress Notes (Signed)
Cardiology Office Note:   Date:  05/13/2020  NAME:  Cassandra Kim    MRN: [id number] DOB:  February 25, 1976   PCP:  Gerlene Fee, DO  Cardiologist:  No primary care provider on file.   Referring MD: Salvadore Dom, MD   Chief Complaint  Patient presents with  . Pre-op Exam   History of Present Illness:   Cassandra Kim is a 45 y.o. female with a hx of DM who is being seen today for the evaluation of abnormal EKG at the request of Salvadore Dom, MD. Planned for hysterectomy due to uterine bleeding 2/2 fibroids. Found to have abnormal EKG and referred to [place]. EKG 04/23/2020 shows likely LVH with secondary left axis deviation.   Your EKG today in office shows normal sinus rhythm with no acute ischemic changes.  There is a possible old anteroseptal infarct which I think is breast artifact.  Her BMI is 44.  I suspect all this is breast artifact. Regarding her EKG she reports no symptoms of chest pain.  She does not exercise routinely but has no limitations.  She can climb a flight of stairs without any major limitations.  There is no history of heart attack or stroke.  She reports this was all surprise to her.  This is also created a lot of stress for her.  She will have surgery related to fibroids.  She is apparently had transfusions due to bleeding fibroids.  She will undergo a hysterectomy.  Blood pressure is 134/82.  She does have a longstanding history of diabetes.  A1c 8.4.  No recent lipid profile.  She is not on a statin.  I did discuss with her that this needs to be checked.  She is not fasting today.  We will plan to do this in the next few weeks.  She also reports she gets episodes of palpitations that occur daily.  She can get 2 episodes per day.  They last seconds.  They are associated with stress.  Associated symptoms also include shortness of breath.  Symptoms go away rather quickly.  No recent thyroid in the system.  She does snore.  She also is tired excessively.   Possibly has sleep apnea.  She falls asleep easily. She is a never smoker.  She does not drink or use drugs.  She has had several surgeries in the past without any issues with anesthesia.  Problem List 1. DM -A1c 8.4 2. Obesity -BMI 44  Past Medical History: Past Medical History:  Diagnosis Date  . Abnormal uterine bleeding   . Anemia   . Diabetes mellitus without complication ([place])    type 2   . Fibroid   . GERD (gastroesophageal reflux disease)   . Obesity     Past Surgical History: Past Surgical History:  Procedure Laterality Date  . BREAST REDUCTION SURGERY Bilateral   . HYSTEROSCOPY WITH SALPINGOGRAM    . KIDNEY STONE SURGERY    . LAPAROSCOPIC GASTRIC SLEEVE RESECTION    . PANNICULECTOMY      Current Medications: Current Meds  Medication Sig  . Blood Glucose Monitoring Suppl w/Device KIT 1 kit by Does not apply route daily.  . dapagliflozin propanediol (FARXIGA) 10 MG TABS tablet Take 1 tablet (10 mg total) by mouth daily.  [employer] (ORIAHNN) 300-1-0.5 & 300 MG CPPK Take 1 capsule by mouth in the morning and at bedtime.  Marland Kitchen FERREX 150 150 MG capsule TAKE 1 CAPSULE BY MOUTH EVERY DAY  .  Ferrous Sulfate (IRON PO) Take 25 mg by mouth daily.  Marland Kitchen gabapentin (NEURONTIN) 100 MG capsule Take 1 capsule (100 mg total) by mouth at bedtime.  Marland Kitchen glucose blood test strip Use as instructed  . hydrOXYzine (ATARAX/VISTARIL) 25 MG tablet Take 1 tablet (25 mg total) by mouth 3 (three) times daily as needed for itching.  Elmore Guise Devices (LANCING DEVICE) MISC 1 Units by Does not apply route daily.  . Lancets (ONETOUCH ULTRASOFT) lancets Use as instructed  . Menthol-Methyl Salicylate (TIGER BALM LINIMENT EX) Apply 1 application topically daily as needed (aches and pain).  . metFORMIN (GLUCOPHAGE-XR) 500 MG 24 hr tablet Take 2 tablets (1,000 mg total) by mouth in the morning and at bedtime.     Allergies:    Patient has no known allergies.   Social  History: Social History   Socioeconomic History  . Marital status: Single    Spouse name: Not on file  . Number of children: 0  . Years of education: Not on file  . Highest education level: Not on file  Occupational History  . Not on file  Tobacco Use  . Smoking status: Never Smoker  . Smokeless tobacco: Never Used  Vaping Use  . Vaping Use: Never used  Substance and Sexual Activity  . Alcohol use: Never  . Drug use: Never  . Sexual activity: Not Currently    Partners: Female    Birth control/protection: Abstinence    Comment: last SA x9 years ago per patient  Other Topics Concern  . Not on file  Social History Narrative  . Not on file   Social Determinants of Health   Financial Resource Strain: Not on file  Food Insecurity: Not on file  Transportation Needs: Not on file  Physical Activity: Not on file  Stress: Not on file  Social Connections: Not on file     Family History: The patient's family history includes Asthma in her mother; Cancer in her sister; Diabetes in her mother.  ROS:   All other ROS reviewed and negative. Pertinent positives noted in the HPI.     EKGs/Labs/Other Studies Reviewed:   The following studies were personally reviewed by me today:  EKG:  EKG is ordered today.  The ekg ordered today demonstrates normal sinus rhythm heart rate 74, possible old anteroseptal infarct, could be artifact, no acute ischemic changes, no evidence of prior infarction, and was personally reviewed by me.   Recent Labs: 07/05/2019: TSH 1.660 04/23/2020: ALT 28; BUN 10; Creatinine, Ser 0.46; Hemoglobin 13.9; Platelets 225; Potassium 3.9; Sodium 136   Recent Lipid Panel No results found for: CHOL, TRIG, HDL, CHOLHDL, VLDL, LDLCALC, LDLDIRECT  Physical Exam:   VS:  BP 134/82   Pulse 74   Ht 5' 4"  (1.626 m)   Wt 256 lb 6.4 oz (116.3 kg)   SpO2 95%   BMI 44.01 kg/m    Wt Readings from Last 3 Encounters:  05/13/20 256 lb 6.4 oz (116.3 kg)  04/07/20 249 lb (112.9  kg)  03/17/20 247 lb (112 kg)    General: Well nourished, well developed, in no acute distress Head: Atraumatic, normal size  Eyes: PEERLA, EOMI  Neck: Supple, no JVD Endocrine: No thryomegaly Cardiac: Normal S1, S2; RRR; no murmurs, rubs, or gallops Lungs: Clear to auscultation bilaterally, no wheezing, rhonchi or rales  Abd: Soft, nontender, no hepatomegaly  Ext: No edema, pulses 2+ Musculoskeletal: No deformities, BUE and BLE strength normal and equal Skin: Warm and dry, no rashes  Neuro: Alert and oriented to person, place, time, and situation, CNII-XII grossly intact, no focal deficits  Psych: Normal mood and affect   ASSESSMENT:   Cassandra Kim is a 45 y.o. female who presents for the following: 1. Nonspecific abnormal electrocardiogram (ECG) (EKG)   2. Palpitations   3. Mixed hyperlipidemia   4. Snoring   5. Chronic fatigue   6. Obesity, morbid, BMI 40.0-49.9 ([place])     PLAN:   1. Nonspecific abnormal electrocardiogram (ECG) (EKG) -EKG in office is largely normal.  I did review her EKG from mid December which shows LVH and likely old anterior infarct related to obesity versus hypertension.  Interestingly, she has no evidence of hypertension today.  Her EKG in office does show poor R wave progression which is likely obesity related artifact.  She has no history of heart attack or stroke.  To sort this out we will obtain an echocardiogram.  This will reassure all parties involved that she is okay for surgery. -Regarding preoperative assessment she is able to complete greater than 4 METS without any chest pain or shortness of breath.  She does not need a stress test.  She needs an echocardiogram as above.  I will communicate the findings of the echocardiogram to her surgeon and likely she can proceed with surgery pending this evaluation.  2. Palpitations -Infrequent episodes of palpitations.  Stress related.  EKG normal.  We will continue to monitor.  She does need an  updated TSH.  We will obtain 1 in the next few weeks.  3. Mixed hyperlipidemia -She is diabetic with an A1c of 8.4.  No recent lipid profile.  She is not on a statin.  We will plan to check a lipid profile in the next few weeks when she is fasting.  She should be on a statin and we will prescribe 1 accordingly.  4. Snoring 5. Chronic fatigue 6. Obesity, morbid, BMI 40.0-49.9 ([place]) -She snores.  She has fatigue.  I suspect she has sleep apnea.  Home sleep study.  Disposition: Return in about 3 months (around 08/11/2020).  Medication Adjustments/Labs and Tests Ordered: Current medicines are reviewed at length with the patient today.  Concerns regarding medicines are outlined above.  Orders Placed This Encounter  Procedures  . Lipid panel  . TSH  . EKG 12-Lead  . ECHOCARDIOGRAM COMPLETE  . Home sleep test   No orders of the defined types were placed in this encounter.   Patient Instructions  Medication Instructions:  The current medical regimen is effective;  continue present plan and medications.  *If you need a refill on your cardiac medications before your next appointment, please call your pharmacy*   Lab Work: LIPID/TSH when you go for ECHO  If you have labs (blood work) drawn today and your tests are completely normal, you will receive your results only by: Marland Kitchen MyChart Message (if you have MyChart) OR . A paper copy in the mail If you have any lab test that is abnormal or we need to change your treatment, we will call you to review the results.   Testing/Procedures: Echocardiogram - Your physician has requested that you have an echocardiogram. Echocardiography is a painless test that uses sound waves to create images of your heart. It provides your doctor with information about the size and shape of your heart and how well your heart's chambers and valves are working. This procedure takes approximately one hour. There are no restrictions for this procedure. This will be  performed at our [facility] location - [address].    Follow-Up: At [facility], you and your health needs are our priority.  As part of our continuing mission to provide you with exceptional heart care, we have created designated Provider Care Teams.  These Care Teams include your primary Cardiologist (physician) and Advanced Practice Providers (APPs -  Physician Assistants and Nurse Practitioners) who all work together to provide you with the care you need, when you need it.  We recommend signing up for the patient portal called "MyChart".  Sign up information is provided on this After Visit Summary.  MyChart is used to connect with patients for Virtual Visits (Telemedicine).  Patients are able to view lab/test results, encounter notes, upcoming appointments, etc.  Non-urgent messages can be sent to your provider as well.   To learn more about what you can do with MyChart, go to [email or web address].    Your next appointment:   3 month(s)  The format for your next appointment:   In Person  Provider:   Eleonore Chiquito, MD   Other Instructions Home sleep study ordered- sleep coordinator will reach out to you to let you know the information on this.        Signed, Addison Naegeli. Audie Box, [place]  [address], [place], [place] [address] [phone]  05/13/2020 9:19 AM

## 2020-05-13 ENCOUNTER — Other Ambulatory Visit: Payer: Self-pay

## 2020-05-13 ENCOUNTER — Encounter: Payer: Self-pay | Admitting: Cardiovascular Disease

## 2020-05-13 ENCOUNTER — Ambulatory Visit (INDEPENDENT_AMBULATORY_CARE_PROVIDER_SITE_OTHER): Payer: BC Managed Care – PPO | Admitting: Cardiovascular Disease

## 2020-05-13 VITALS — BP 134/82 | HR 74 | Ht 64.0 in | Wt 256.4 lb

## 2020-05-13 DIAGNOSIS — R5382 Chronic fatigue, unspecified: Secondary | ICD-10-CM

## 2020-05-13 DIAGNOSIS — R0683 Snoring: Secondary | ICD-10-CM | POA: Diagnosis not present

## 2020-05-13 DIAGNOSIS — E782 Mixed hyperlipidemia: Secondary | ICD-10-CM | POA: Diagnosis not present

## 2020-05-13 DIAGNOSIS — R002 Palpitations: Secondary | ICD-10-CM

## 2020-05-13 DIAGNOSIS — R9431 Abnormal electrocardiogram [ECG] [EKG]: Secondary | ICD-10-CM | POA: Diagnosis not present

## 2020-05-13 NOTE — Patient Instructions (Signed)
Medication Instructions:  The current medical regimen is effective;  continue present plan and medications.  *If you need a refill on your cardiac medications before your next appointment, please call your pharmacy*   Lab Work: LIPID/TSH when you go for ECHO  If you have labs (blood work) drawn today and your tests are completely normal, you will receive your results only by: Marland Kitchen MyChart Message (if you have MyChart) OR . A paper copy in the mail If you have any lab test that is abnormal or we need to change your treatment, we will call you to review the results.   Testing/Procedures: Echocardiogram - Your physician has requested that you have an echocardiogram. Echocardiography is a painless test that uses sound waves to create images of your heart. It provides your doctor with information about the size and shape of your heart and how well your heart's chambers and valves are working. This procedure takes approximately one hour. There are no restrictions for this procedure. This will be performed at our [facility] location - [address].    Follow-Up: At [facility], you and your health needs are our priority.  As part of our continuing mission to provide you with exceptional heart care, we have created designated Provider Care Teams.  These Care Teams include your primary Cardiologist (physician) and Advanced Practice Providers (APPs -  Physician Assistants and Nurse Practitioners) who all work together to provide you with the care you need, when you need it.  We recommend signing up for the patient portal called "MyChart".  Sign up information is provided on this After Visit Summary.  MyChart is used to connect with patients for Virtual Visits (Telemedicine).  Patients are able to view lab/test results, encounter notes, upcoming appointments, etc.  Non-urgent messages can be sent to your provider as well.   To learn more about what you can do with MyChart, go to  [email or web address].    Your next appointment:   3 month(s)  The format for your next appointment:   In Person  Provider:   Eleonore Chiquito, MD   Other Instructions Home sleep study ordered- sleep coordinator will reach out to you to let you know the information on this.

## 2020-05-22 NOTE — Telephone Encounter (Signed)
MyChart message not read.  Call returned to patient, no answer. Voicemail not set up.

## 2020-05-26 ENCOUNTER — Ambulatory Visit: Payer: Self-pay | Admitting: Obstetrics and Gynecology

## 2020-06-03 ENCOUNTER — Ambulatory Visit (HOSPITAL_COMMUNITY): Payer: BC Managed Care – PPO | Attending: Cardiology

## 2020-06-03 ENCOUNTER — Other Ambulatory Visit: Payer: BC Managed Care – PPO | Admitting: *Deleted

## 2020-06-03 ENCOUNTER — Other Ambulatory Visit: Payer: Self-pay

## 2020-06-03 DIAGNOSIS — R9431 Abnormal electrocardiogram [ECG] [EKG]: Secondary | ICD-10-CM | POA: Insufficient documentation

## 2020-06-03 LAB — LIPID PANEL
Chol/HDL Ratio: 3.6 ratio (ref 0.0–4.4)
Cholesterol, Total: 162 mg/dL (ref 100–199)
HDL: 45 mg/dL (ref >39)
LDL Chol Calc (NIH): 92 mg/dL (ref 0–99)
Triglycerides: 144 mg/dL (ref 0–149)
VLDL Cholesterol Cal: 25 mg/dL (ref 5–40)

## 2020-06-03 LAB — TSH: TSH: 1.19 u[IU]/mL (ref 0.450–4.500)

## 2020-06-03 LAB — ECHOCARDIOGRAM COMPLETE
Area-P 1/2: 3.6 cm2
S' Lateral: 2.6 cm

## 2020-06-04 ENCOUNTER — Other Ambulatory Visit: Payer: Self-pay

## 2020-06-04 MED ORDER — ROSUVASTATIN CALCIUM 20 MG PO TABS: 20.0000 mg | ORAL_TABLET | Freq: Every day | ORAL | 3 refills | Status: AC

## 2020-06-30 ENCOUNTER — Other Ambulatory Visit: Payer: Self-pay | Admitting: *Deleted

## 2020-06-30 NOTE — Telephone Encounter (Signed)
Left message to call Sharee Pimple, RN at [place], [phone].  Call placed to patient to discuss r/s surgery.

## 2020-07-01 NOTE — Telephone Encounter (Signed)
Call returned to patient. Left message to call Sharee Pimple, RN at [place], [phone].   Patient left message at 1626 on 06/30/20. Declines to proceed with surgery at this time, has moved to [facility], request return call to further discuss.

## 2020-07-03 NOTE — Telephone Encounter (Signed)
Spoke with patient. Patient states she has moved to [facility], declines to proceed with surgery at this time. She is planning to establish care with another provider in [facility].   She is still taking Oriahnn twice daily, request refill to pharmacy on file until she can get established with new provider. Advised I will forward request to Dr. Talbert Nan to review, patient is aware she is out of the office today, response may not be immediate.   Address updated per patient request.   Patient has additional questions for business office, message sent.  Patient thankful for f/u.   Rx pended.   Routing to Dr. Talbert Nan for review.   Cc: Hayley Carder

## 2020-07-04 MED ORDER — ORIAHNN 300-1-0.5 & 300 MG PO CPPK: 1.0000 | ORAL_CAPSULE | Freq: Two times a day (BID) | ORAL | 2 refills | Status: AC

## 2020-08-09 ENCOUNTER — Other Ambulatory Visit: Payer: Self-pay | Admitting: Family Medicine

## 2020-08-09 DIAGNOSIS — E119 Type 2 diabetes mellitus without complications: Secondary | ICD-10-CM

## 2020-08-14 ENCOUNTER — Ambulatory Visit: Payer: BC Managed Care – PPO | Admitting: Cardiovascular Disease

## 2020-11-04 ENCOUNTER — Other Ambulatory Visit: Payer: Self-pay | Admitting: Family Medicine

## 2020-11-04 DIAGNOSIS — E119 Type 2 diabetes mellitus without complications: Secondary | ICD-10-CM

## 2020-12-10 ENCOUNTER — Other Ambulatory Visit: Payer: Self-pay | Admitting: Family Medicine

## 2020-12-10 DIAGNOSIS — E119 Type 2 diabetes mellitus without complications: Secondary | ICD-10-CM

## 2020-12-27 ENCOUNTER — Other Ambulatory Visit: Payer: Self-pay | Admitting: Family Medicine

## 2020-12-27 DIAGNOSIS — E119 Type 2 diabetes mellitus without complications: Secondary | ICD-10-CM

## 2021-01-07 ENCOUNTER — Other Ambulatory Visit: Payer: Self-pay | Admitting: Family Medicine

## 2021-10-05 ENCOUNTER — Encounter: Payer: Self-pay | Admitting: *Deleted

## 2021-11-12 DIAGNOSIS — I10 Essential (primary) hypertension: Secondary | ICD-10-CM | POA: Insufficient documentation

## 2021-11-12 DIAGNOSIS — G40119 Localization-related (focal) (partial) symptomatic epilepsy and epileptic syndromes with simple partial seizures, intractable, without status epilepticus: Secondary | ICD-10-CM | POA: Insufficient documentation

## 2021-11-13 DIAGNOSIS — Z91199 Patient's noncompliance with other medical treatment and regimen due to unspecified reason: Secondary | ICD-10-CM | POA: Insufficient documentation

## 2022-01-20 DIAGNOSIS — F411 Generalized anxiety disorder: Secondary | ICD-10-CM | POA: Insufficient documentation

## 2022-01-20 DIAGNOSIS — G894 Chronic pain syndrome: Secondary | ICD-10-CM | POA: Insufficient documentation

## 2022-03-21 DIAGNOSIS — E1142 Type 2 diabetes mellitus with diabetic polyneuropathy: Secondary | ICD-10-CM | POA: Insufficient documentation

## 2022-04-13 IMAGING — CT CT ABD-PELV W/ CM
2 of 4 series · 17 of 46 positions shown, 19 images · IV contrast (omnipaque)
Comparison: Pelvic ultrasound 07/19/2019

CLINICAL DATA: Abdominal pain, vaginal

EXAM:
CT ABDOMEN AND PELVIS WITH CONTRAST
TECHNIQUE: Multidetector CT imaging of the abdomen and pelvis was performed
using the standard protocol following bolus administration of
intravenous contrast.
CONTRAST:  100mL OMNIPAQUE IOHEXOL 300 MG/ML  SOLN

[Series 2: axial st · axial · 0.87mm/px · z∈[-210,+190]mm · 14 of 88 slices shown, 16 images]
[im 4/88  soft-tissue]
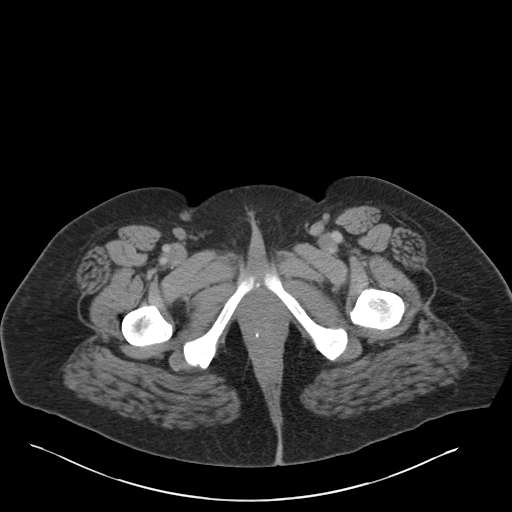
[im 4/88  bone]
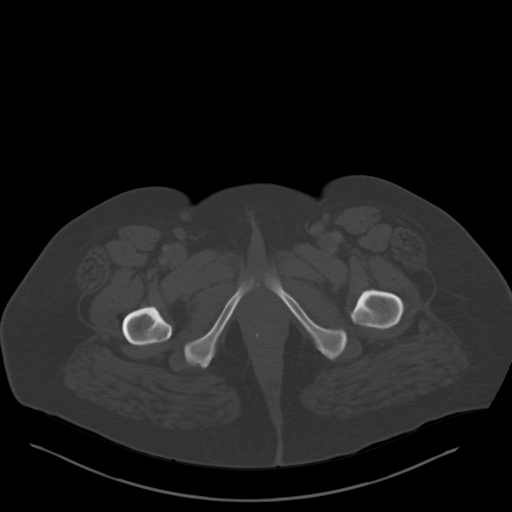
[im 12/88  soft-tissue]
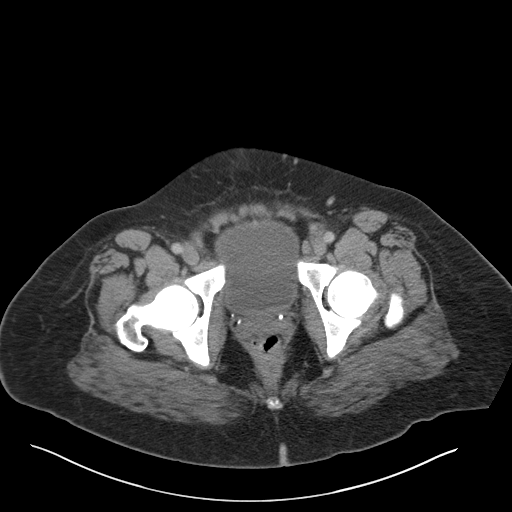
[im 16/88  soft-tissue]
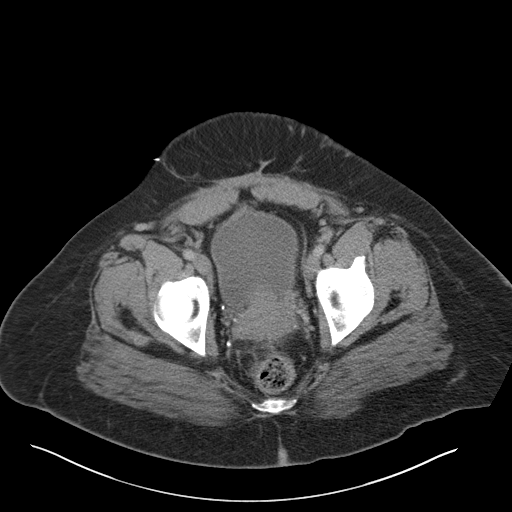
[im 23/88  soft-tissue]
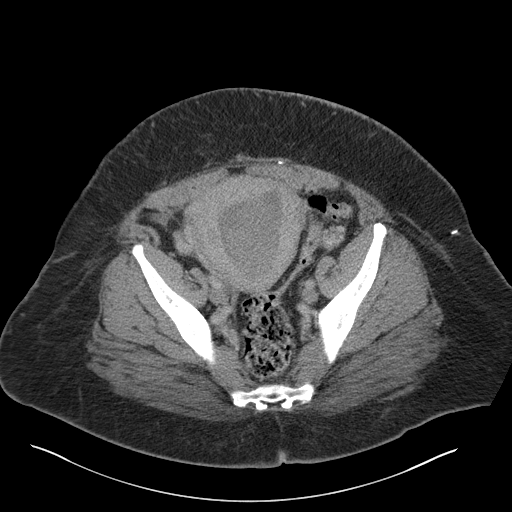
[im 31/88  soft-tissue]
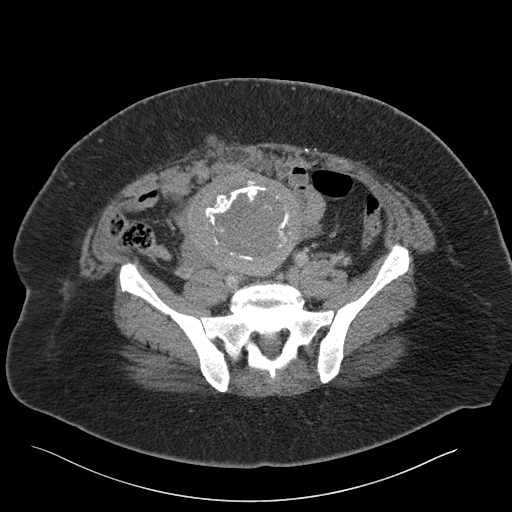
[im 35/88  soft-tissue]
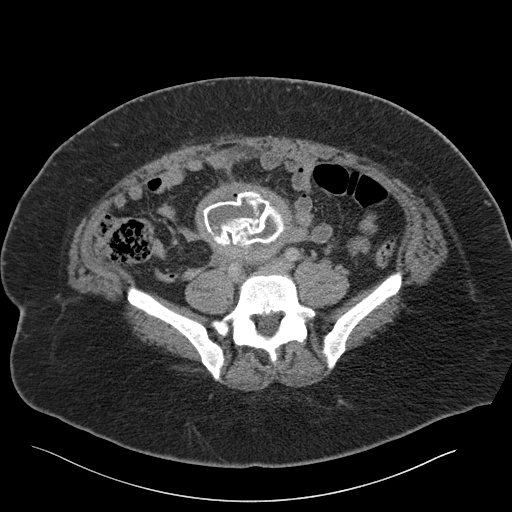
[im 42/88  soft-tissue]
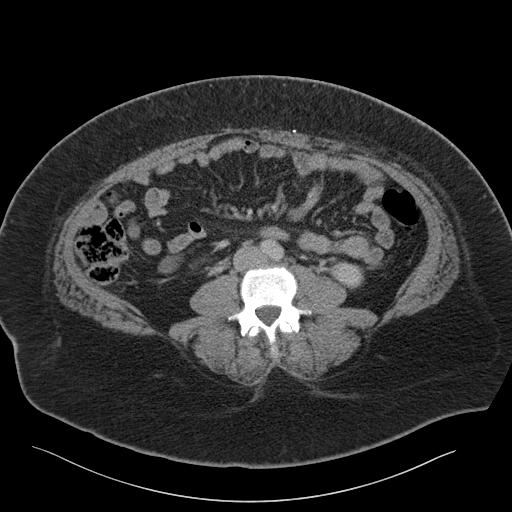
[im 46/88  soft-tissue]
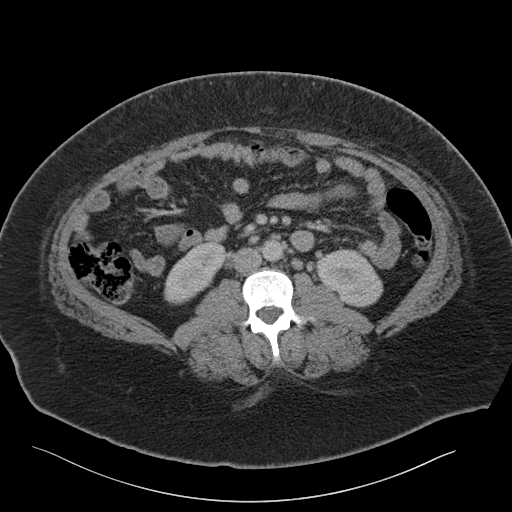
[im 53/88  soft-tissue]
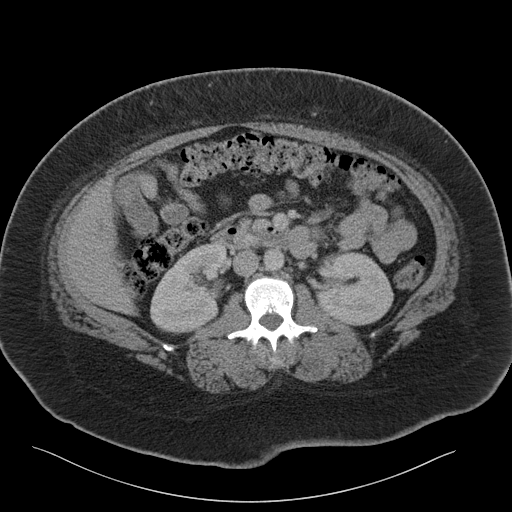
[im 53/88  bone]
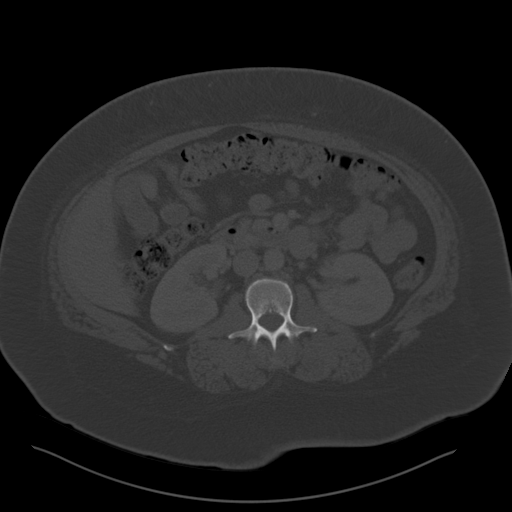
[im 57/88  soft-tissue]
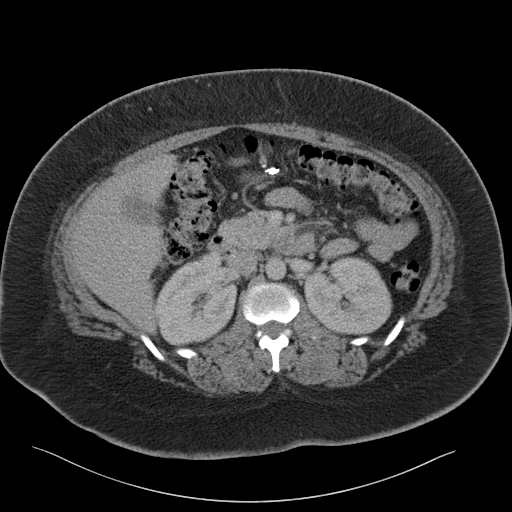
[im 65/88  soft-tissue]
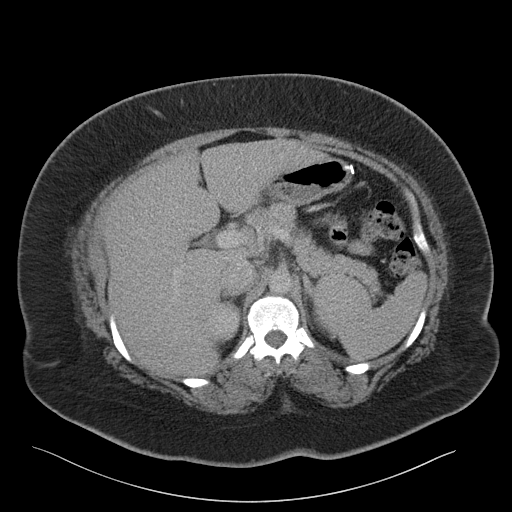
[im 72/88  soft-tissue]
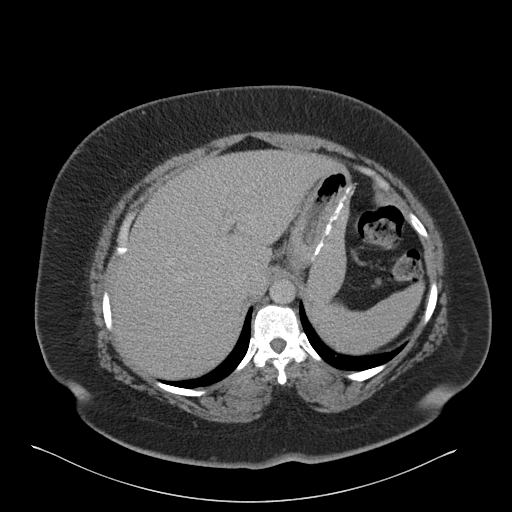
[im 76/88  soft-tissue]
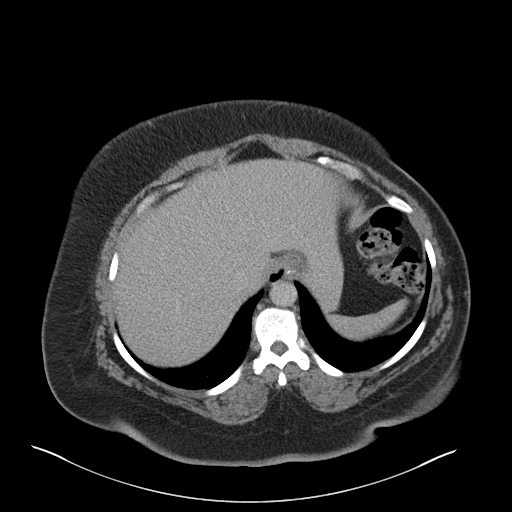
[im 84/88  soft-tissue]
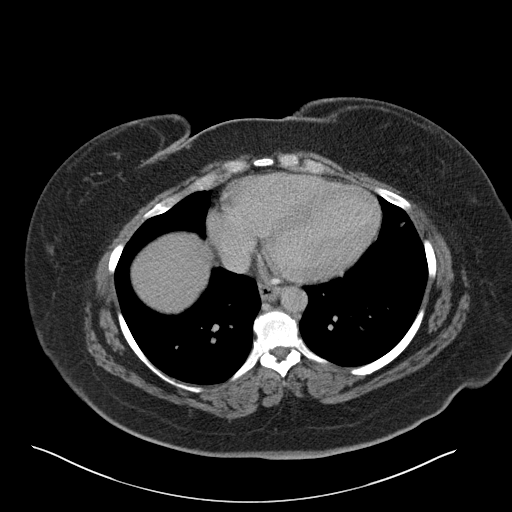

[Series 4: coronal st · coronal · 0.85mm/px · 3 of 181 slices shown]
[im 61/181  soft-tissue]
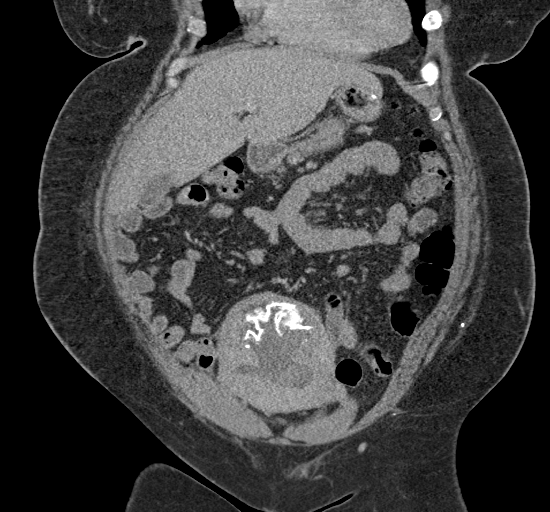
[im 81/181  soft-tissue]
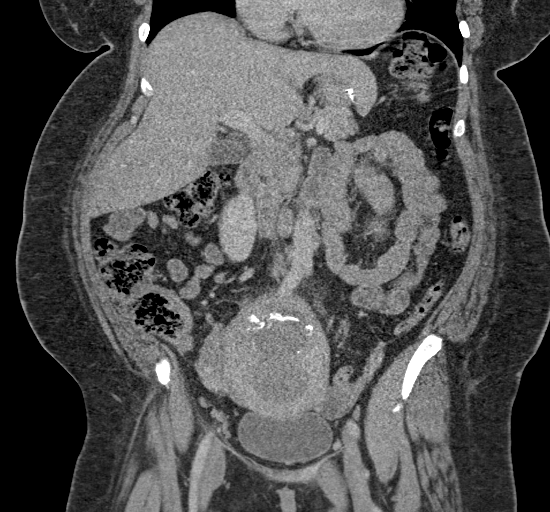
[im 101/181  soft-tissue]
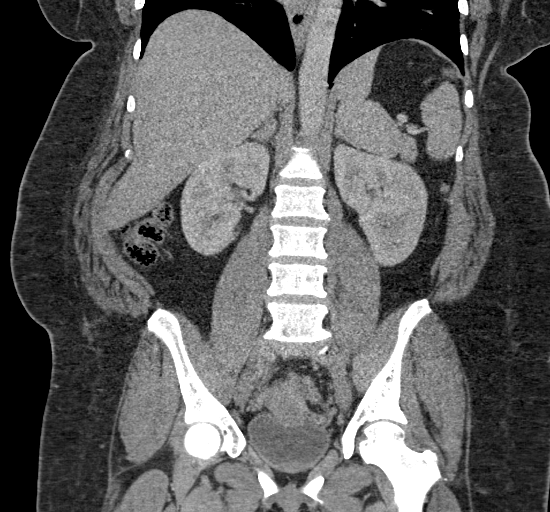

[17 of 46 positions shown; findings below may reference images not displayed]

FINDINGS: Lower chest: Lung bases are clear. No effusions. Heart is normal
size.

Hepatobiliary: Gallstones noted within the gallbladder. No focal
hepatic abnormality or biliary ductal dilatation.

Pancreas: No focal abnormality or ductal dilatation.

Spleen: No focal abnormality.  Normal size.

Adrenals/Urinary Tract: No adrenal abnormality. No focal renal
abnormality. No stones or hydronephrosis. Urinary bladder is
unremarkable.

Stomach/Bowel: Normal appendix. Stomach, large and small bowel
grossly unremarkable. Prior gastric sleeve.

Vascular/Lymphatic: No evidence of aneurysm or adenopathy.

Reproductive: Large partially calcified fibroid within the fundus
measuring up to 8.4 cm on the sagittal image. This appears to extend
to the endometrium in the endometrium appears thickened measuring 31
mm with internal soft tissue density, possibly blood products. No
adnexal mass.

Other: No free fluid or free air.

Musculoskeletal: No acute bony abnormality.
IMPRESSION: Enlarged fibroid uterus with large central 8.4 cm partially
calcified fibroid.

Thickened endometrium measuring up to 31 mm with internal soft
tissue, possibly blood products/clot. Endometrium was not well
visualized on prior ultrasound due to the calcified fibroid.

Cholelithiasis.

Prior gastric sleeve.

Normal appendix.

## 2022-05-02 DIAGNOSIS — J189 Pneumonia, unspecified organism: Secondary | ICD-10-CM | POA: Insufficient documentation

## 2022-05-09 ENCOUNTER — Telehealth: Payer: Self-pay

## 2022-05-09 NOTE — Telephone Encounter (Signed)
Transition Care Management Unsuccessful Follow-up Telephone Call  Date of discharge and from where:  [facility]  Attempts:  1st Attempt  Reason for unsuccessful TCM follow-up call:  Voice mail full Juanda Crumble, [place] Direct Dial [phone]

## 2022-05-10 NOTE — Telephone Encounter (Signed)
Transition Care Management Unsuccessful Follow-up Telephone Call  Date of discharge and from where:  [facility]  Attempts:  2nd Attempt  Reason for unsuccessful TCM follow-up call:  Left voice message Juanda Crumble, [place] Direct Dial [phone]

## 2022-05-11 NOTE — Telephone Encounter (Signed)
Transition Care Management Unsuccessful Follow-up Telephone Call  Date of discharge and from where:  Cassandra Kim 05/07/2022  Attempts:  3rd Attempt  Reason for unsuccessful TCM follow-up call:  Voice mail full Juanda Crumble, [place] Direct Dial [phone]

## 2023-01-24 ENCOUNTER — Encounter: Payer: Managed Care, Other (non HMO) | Admitting: Family Medicine

## 2023-02-04 DIAGNOSIS — M25512 Pain in left shoulder: Secondary | ICD-10-CM | POA: Diagnosis not present

## 2023-02-04 DIAGNOSIS — M5386 Other specified dorsopathies, lumbar region: Secondary | ICD-10-CM | POA: Diagnosis not present

## 2023-03-09 ENCOUNTER — Emergency Department (HOSPITAL_COMMUNITY): Payer: Managed Care, Other (non HMO)

## 2023-03-09 ENCOUNTER — Other Ambulatory Visit: Payer: Self-pay

## 2023-03-09 ENCOUNTER — Encounter (HOSPITAL_COMMUNITY): Payer: Self-pay | Admitting: *Deleted

## 2023-03-09 ENCOUNTER — Emergency Department (HOSPITAL_COMMUNITY)
Admission: EM | Admit: 2023-03-09 | Discharge: 2023-03-09 | Disposition: A | Discharge status: Home / Self Care | Payer: Managed Care, Other (non HMO) | Attending: Emergency Medicine | Admitting: Emergency Medicine

## 2023-03-09 DIAGNOSIS — S93402A Sprain of unspecified ligament of left ankle, initial encounter: Secondary | ICD-10-CM | POA: Diagnosis not present

## 2023-03-09 DIAGNOSIS — W01198A Fall on same level from slipping, tripping and stumbling with subsequent striking against other object, initial encounter: Secondary | ICD-10-CM | POA: Diagnosis not present

## 2023-03-09 DIAGNOSIS — W19XXXA Unspecified fall, initial encounter: Secondary | ICD-10-CM

## 2023-03-09 DIAGNOSIS — M7989 Other specified soft tissue disorders: Secondary | ICD-10-CM | POA: Diagnosis not present

## 2023-03-09 DIAGNOSIS — T148XXA Other injury of unspecified body region, initial encounter: Secondary | ICD-10-CM

## 2023-03-09 DIAGNOSIS — S99912A Unspecified injury of left ankle, initial encounter: Secondary | ICD-10-CM | POA: Diagnosis present

## 2023-03-09 MED ORDER — CYCLOBENZAPRINE HCL 10 MG PO TABS: 10.0000 mg | ORAL_TABLET | Freq: Three times a day (TID) | ORAL | 0 refills | Status: AC | PRN

## 2023-03-09 MED ORDER — CYCLOBENZAPRINE HCL 10 MG PO TABS
10.0000 mg | ORAL_TABLET | Freq: Three times a day (TID) | ORAL | 0 refills | Status: DC | PRN
Start: 2023-03-09 — End: 2023-03-09

## 2023-03-09 MED ORDER — OXYCODONE-ACETAMINOPHEN 5-325 MG PO TABS
1.0000 | ORAL_TABLET | Freq: Once | ORAL | Status: AC
  Administered 2023-03-09: 1 via ORAL
  Filled 2023-03-09: qty 1

## 2023-03-09 NOTE — Progress Notes (Signed)
Orthopedic Tech Progress Note Patient Details:  YAIRA BERNARDI [birth year] [id number]  As I was walking by I noticed patient had on an ASO ANKLE BRACE that was not applied correctly, so I fixed it on my way back upstairs. Patient stated it did not feel right, but once I fixed it she stated it felt much better.     Patient ID: ZAMIAH TOLLETT, female   DOB: Jul 30, 1975, 47 y.o.   MRN: [id number]  Donald Pore 03/09/2023, 4:34 AM

## 2023-03-09 NOTE — ED Triage Notes (Signed)
The pt fell off a treadmill while excercising around 2000  she has pain in the rt neck and face she is also having much pain in her lt ankle and her lt foot the foot was caught under  machine  lmp the middle of october

## 2023-03-09 NOTE — ED Provider Notes (Signed)
[facility] Emergency Department Provider Note MRN:  [id number]  Arrival date & time: 03/09/23     Chief Complaint   Fall   History of Present Illness   Cassandra Kim is a 47 y.o. year-old female presents to the ED with chief complaint of left ankle pain.  She was on a treadmill and thinks she missed hitting the stop button and lost her balance and fell.  She states that her ankle rolled.  She states that she was able to walk following the injury, but after waking up after a nap, she noticed swelling of the ankle and some stiffness on the right side of her neck.    History provided by patient.   Review of Systems  Pertinent positive and negative review of systems noted in HPI.    Physical Exam   Vitals:   03/09/23 0141 03/09/23 0204  BP: 123/82   Pulse: 88   Resp: 18   Temp:  98.1 F (36.7 C)  SpO2: 100%     CONSTITUTIONAL:  well-appearing, NAD NEURO:  Alert and oriented x 3, CN 3-12 grossly intact EYES:  eyes equal and reactive ENT/NECK:  Supple, no stridor  CARDIO:  appears well-perfused, intact distal pulses PULM:  No respiratory distress,  GI/GU:  non-distended,  MSK/SPINE: mild swelling of the left ankle, TTP medially and laterally, no bony deformity, no C-spine tenderness, step-off, or deformity, right trap and cervical paraspinal muscle tenderness SKIN:  no rash, atraumatic   *Additional and/or pertinent findings included in MDM below  Diagnostic and Interventional Summary    EKG Interpretation Date/Time:    Ventricular Rate:    PR Interval:    QRS Duration:    QT Interval:    QTC Calculation:   R Axis:      Text Interpretation:         Labs Reviewed - No data to display  DG Ankle Complete Left  Final Result    DG Foot Complete Left  Final Result      Medications  oxyCODONE-acetaminophen (PERCOCET/ROXICET) 5-325 MG per tablet 1 tablet (has no administration in time range)  oxyCODONE-acetaminophen  (PERCOCET/ROXICET) 5-325 MG per tablet 1 tablet (1 tablet Oral Given 03/09/23 0157)     Procedures  /  Critical Care Procedures  ED Course and Medical Decision Making  I have reviewed the triage vital signs, the nursing notes, and pertinent available records from the EMR.  Social Determinants Affecting Complexity of Care: Patient has no clinically significant social determinants affecting this chief complaint..   ED Course: Clinical Course as of 03/09/23 0423  Thu Mar 09, 2023  0423 DG Ankle Complete Left No fracture or dislocation seen [RB]    Clinical Course User Index [RB] Roxy Horseman, PA-C    Medical Decision Making Patient here after falling and rolling her ankle while on a treadmill yesterday.  She is able to ambulate with a limp.  She also states that she woke up with some right upper back and neck pain that seems to be delayed onset muscle soreness associated with the fall and likely muscle strain.    She has some swelling about the left ankle that seems consistent with sprain.  There is no bony deformity or abnormality see on x-ray.    Will give ankle ASO and crutches.  Will trial muscle relaxer for back/neck soreness.  She has ibuprofen 800 at home.  Risk Prescription drug management.         Consultants: No  consultations were needed in caring for this patient.   Treatment and Plan: Emergency department workup does not suggest an emergent condition requiring admission or immediate intervention beyond  what has been performed at this time. The patient is safe for discharge and has  been instructed to return immediately for worsening symptoms, change in  symptoms or any other concerns    Final Clinical Impressions(s) / ED Diagnoses     ICD-10-CM   1. Fall, initial encounter  W19.XXXA     2. Sprain of left ankle, unspecified ligament, initial encounter  S93.402A     3. Muscle strain  T14.8XXA       ED Discharge Orders          Ordered     cyclobenzaprine (FLEXERIL) 10 MG tablet  3 times daily PRN        03/09/23 0418              Discharge Instructions Discussed with and Provided to Patient:   Discharge Instructions   None      Roxy Horseman, PA-C 03/09/23 0423    Sabas Sous, MD 03/09/23 [id number]

## 2023-05-21 ENCOUNTER — Emergency Department (HOSPITAL_COMMUNITY)
Admission: EM | Admit: 2023-05-21 | Discharge: 2023-05-21 | Disposition: A | Discharge status: Home / Self Care | Payer: Managed Care, Other (non HMO) | Attending: Emergency Medicine | Admitting: Emergency Medicine

## 2023-05-21 ENCOUNTER — Other Ambulatory Visit: Payer: Self-pay

## 2023-05-21 ENCOUNTER — Encounter (HOSPITAL_COMMUNITY): Payer: Self-pay

## 2023-05-21 ENCOUNTER — Emergency Department (HOSPITAL_COMMUNITY): Payer: Managed Care, Other (non HMO)

## 2023-05-21 DIAGNOSIS — M545 Low back pain, unspecified: Secondary | ICD-10-CM | POA: Diagnosis present

## 2023-05-21 DIAGNOSIS — J45909 Unspecified asthma, uncomplicated: Secondary | ICD-10-CM | POA: Insufficient documentation

## 2023-05-21 DIAGNOSIS — N3001 Acute cystitis with hematuria: Secondary | ICD-10-CM | POA: Insufficient documentation

## 2023-05-21 DIAGNOSIS — Z20822 Contact with and (suspected) exposure to covid-19: Secondary | ICD-10-CM | POA: Insufficient documentation

## 2023-05-21 DIAGNOSIS — R0602 Shortness of breath: Secondary | ICD-10-CM | POA: Diagnosis not present

## 2023-05-21 DIAGNOSIS — Z7984 Long term (current) use of oral hypoglycemic drugs: Secondary | ICD-10-CM | POA: Diagnosis not present

## 2023-05-21 DIAGNOSIS — Z794 Long term (current) use of insulin: Secondary | ICD-10-CM | POA: Diagnosis not present

## 2023-05-21 DIAGNOSIS — E119 Type 2 diabetes mellitus without complications: Secondary | ICD-10-CM | POA: Diagnosis not present

## 2023-05-21 HISTORY — DX: Unspecified asthma, uncomplicated: J45.909

## 2023-05-21 LAB — COMPREHENSIVE METABOLIC PANEL
ALT: 30 U/L (ref 0–44)
AST: 20 U/L (ref 15–41)
Albumin: 3.1 g/dL — ABNORMAL LOW (ref 3.5–5.0)
Alkaline Phosphatase: 71 U/L (ref 38–126)
Anion gap: 10 (ref 5–15)
BUN: 16 mg/dL (ref 6–20)
CO2: 20 mmol/L — ABNORMAL LOW (ref 22–32)
Calcium: 8.7 mg/dL — ABNORMAL LOW (ref 8.9–10.3)
Chloride: 105 mmol/L (ref 98–111)
Creatinine, Ser: 0.66 mg/dL (ref 0.44–1.00)
GFR, Estimated: >60 mL/min (ref >60)
Glucose, Bld: 166 mg/dL — ABNORMAL HIGH (ref 70–99)
Potassium: 3.8 mmol/L (ref 3.5–5.1)
Sodium: 135 mmol/L (ref 135–145)
Total Bilirubin: 0.4 mg/dL (ref 0.0–1.2)
Total Protein: 6.5 g/dL (ref 6.5–8.1)

## 2023-05-21 LAB — CBC
HCT: 35.8 % — ABNORMAL LOW (ref 36.0–46.0)
Hemoglobin: 11.6 g/dL — ABNORMAL LOW (ref 12.0–15.0)
MCH: 24.3 pg — ABNORMAL LOW (ref 26.0–34.0)
MCHC: 32.4 g/dL (ref 30.0–36.0)
MCV: 74.9 fL — ABNORMAL LOW (ref 80.0–100.0)
Platelets: 321 10*3/uL (ref 150–400)
RBC: 4.78 MIL/uL (ref 3.87–5.11)
RDW: 17.9 % — ABNORMAL HIGH (ref 11.5–15.5)
WBC: 8.4 10*3/uL (ref 4.0–10.5)
nRBC: 0 % (ref 0.0–0.2)

## 2023-05-21 LAB — URINALYSIS, ROUTINE W REFLEX MICROSCOPIC
Bilirubin Urine: NEGATIVE
Glucose, UA: 50 mg/dL — AB
Ketones, ur: NEGATIVE mg/dL
Nitrite: NEGATIVE
Protein, ur: NEGATIVE mg/dL
Specific Gravity, Urine: 1.021 (ref 1.005–1.030)
pH: 6 (ref 5.0–8.0)

## 2023-05-21 LAB — RESP PANEL BY RT-PCR (RSV, FLU A&B, COVID)  RVPGX2
Influenza A by PCR: NEGATIVE
Influenza B by PCR: NEGATIVE
Resp Syncytial Virus by PCR: NEGATIVE
SARS Coronavirus 2 by RT PCR: NEGATIVE

## 2023-05-21 LAB — D-DIMER, QUANTITATIVE: D-Dimer, Quant: <0.27 ug{FEU}/mL (ref 0.00–0.50)

## 2023-05-21 LAB — HCG, QUANTITATIVE, PREGNANCY: hCG, Beta Chain, Quant, S: <1 m[IU]/mL (ref <5)

## 2023-05-21 LAB — BRAIN NATRIURETIC PEPTIDE: B Natriuretic Peptide: 15.1 pg/mL (ref 0.0–100.0)

## 2023-05-21 LAB — TROPONIN I (HIGH SENSITIVITY)
Troponin I (High Sensitivity): 5 ng/L (ref <18)
Troponin I (High Sensitivity): 6 ng/L (ref <18)

## 2023-05-21 LAB — PREGNANCY, URINE: Preg Test, Ur: NEGATIVE

## 2023-05-21 MED ORDER — MORPHINE SULFATE (PF) 4 MG/ML IV SOLN
4.0000 mg | Freq: Once | INTRAVENOUS | Status: AC
  Administered 2023-05-21: 4 mg via INTRAVENOUS
  Filled 2023-05-21: qty 1

## 2023-05-21 MED ORDER — KETOROLAC TROMETHAMINE 15 MG/ML IJ SOLN
15.0000 mg | Freq: Once | INTRAMUSCULAR | Status: AC
  Administered 2023-05-21: 15 mg via INTRAVENOUS
  Filled 2023-05-21: qty 1

## 2023-05-21 MED ORDER — METHOCARBAMOL 500 MG PO TABS: 500.0000 mg | ORAL_TABLET | Freq: Two times a day (BID) | ORAL | 0 refills | Status: DC

## 2023-05-21 MED ORDER — CEFADROXIL 500 MG PO CAPS: 500.0000 mg | ORAL_CAPSULE | Freq: Two times a day (BID) | ORAL | 0 refills | Status: DC

## 2023-05-21 NOTE — Discharge Instructions (Addendum)
Please use Tylenol or ibuprofen for pain.  You may use 600 mg ibuprofen every 6 hours or 1000 mg of Tylenol every 6 hours.  You may choose to alternate between the 2.  This would be most effective.  Not to exceed 4 g of Tylenol within 24 hours.  Not to exceed 3200 mg ibuprofen 24 hours.  You can use the muscle relaxant I am prescribing in addition to the above to help with any breakthrough pain.  You can take it up to twice daily.  It is safe to take at night, but I would be cautious taking it during the day as it can cause some drowsiness.  Make sure that you are feeling awake and alert before you get behind the wheel of a car or operate a motor vehicle.  It is not a narcotic pain medication so you are able to take it if it is not making you drowsy and still pilot a vehicle or machinery safely.  Please take the entire course of antibiotics that we have prescribed.

## 2023-05-21 NOTE — ED Triage Notes (Signed)
Pt c.o 2 weeks of sob, worse on exertion. Pt using inhaler more recently but no relief. Non productive cough. Pt also c.o right lower back pain x5 days, hx of kidney stones and states it feels similar.

## 2023-05-21 NOTE — ED Provider Notes (Signed)
[place] EMERGENCY DEPARTMENT AT [facility] Provider Note   CSN: [id number] Arrival date & time: 05/21/23  0754     History  Chief Complaint  Patient presents with   Back Pain   Shortness of Breath    Cassandra Kim is a 48 y.o. female with past medical history significant for obesity, diabetes, asthma who presents with multiple complaints today.  She reports with shortness of breath for the last few weeks, worse on exertion, recently began having a cough.  She works at [employer], and is concerned that she may have been exposed to some upper respiratory illness, she reports that she has been using her inhaler much more frequently than normal.  She also endorses some right lower back pain for 5 days, she reports it does feel similar to previous kidney stones.  She denies any previous history of back pain, sciatica.  She does report worse with movement/walking.   Back Pain Shortness of Breath      Home Medications Prior to Admission medications   Medication Sig Start Date End Date Taking? Authorizing Provider  cefadroxil (DURICEF) 500 MG capsule Take 1 capsule (500 mg total) by mouth 2 (two) times daily. 05/21/23  Yes Monta Police H, PA-C  methocarbamol (ROBAXIN) 500 MG tablet Take 1 tablet (500 mg total) by mouth 2 (two) times daily. 05/21/23  Yes Leisha Trinkle H, PA-C  Blood Glucose Monitoring Suppl w/Device KIT 1 kit by Does not apply route daily. 02/18/20   Autry-Lott, Randa Evens, DO  cyclobenzaprine (FLEXERIL) 10 MG tablet Take 1 tablet (10 mg total) by mouth 3 (three) times daily as needed for muscle spasms. 03/09/23   Roxy Horseman, PA-C  dapagliflozin propanediol (FARXIGA) 10 MG TABS tablet Take 1 tablet (10 mg total) by mouth daily. 02/18/20   Autry-Lott, Randa Evens, DO  Elagolix-Estrad-Noreth & Elago (ORIAHNN) 300-1-0.5 & 300 MG CPPK Take 1 capsule by mouth in the morning and at bedtime. 07/04/20   Romualdo Bolk, MD  FERREX 150 150 MG  capsule TAKE 1 CAPSULE BY MOUTH EVERY DAY 01/27/20   Romualdo Bolk, MD  Ferrous Sulfate (IRON PO) Take 25 mg by mouth daily.    [provider]  gabapentin (NEURONTIN) 100 MG capsule TAKE 1 CAPSULE BY MOUTH AT BEDTIME. 01/07/21   Autry-Lott, Randa Evens, DO  glucose blood test strip Use as instructed 02/18/20   Autry-Lott, Randa Evens, DO  hydrOXYzine (ATARAX/VISTARIL) 25 MG tablet Take 1 tablet (25 mg total) by mouth 3 (three) times daily as needed for itching. 02/12/20   Simmons-Robinson, Tawanna Cooler, MD  Lancet Devices (LANCING DEVICE) MISC 1 Units by Does not apply route daily. 02/18/20   Autry-Lott, Randa Evens, DO  Lancets (ONETOUCH ULTRASOFT) lancets Use as instructed 02/18/20   Autry-Lott, Randa Evens, DO  Menthol-Methyl Salicylate (TIGER BALM LINIMENT EX) Apply 1 application topically daily as needed (aches and pain).    [provider]  metFORMIN (GLUCOPHAGE-XR) 500 MG 24 hr tablet TAKE 2 TABLETS BY MOUTH IN THE MORNING AND AT BEDTIME. 12/28/20   Autry-Lott, Randa Evens, DO  rosuvastatin (CRESTOR) 20 MG tablet Take 1 tablet (20 mg total) by mouth daily. 06/04/20 09/02/20  O'NealRonnald Ramp, MD      Allergies    Pork-derived products and Shellfish allergy    Review of Systems   Review of Systems  Respiratory:  Positive for shortness of breath.   Musculoskeletal:  Positive for back pain.  All other systems reviewed and are negative.   Physical Exam Updated Vital Signs  BP 137/77   Pulse 74   Temp 97.8 F (36.6 C) (Oral)   Resp (!) 23   LMP 05/15/2023 (Approximate)   SpO2 100%  Physical Exam Vitals and nursing note reviewed.  Constitutional:      General: She is not in acute distress.    Appearance: Normal appearance.  HENT:     Head: Normocephalic and atraumatic.  Eyes:     General:        Right eye: No discharge.        Left eye: No discharge.  Cardiovascular:     Rate and Rhythm: Normal rate and regular rhythm.     Heart sounds: No murmur heard.    No friction rub. No  gallop.  Pulmonary:     Effort: Pulmonary effort is normal.     Breath sounds: Normal breath sounds.     Comments: No wheezing, rhonchi, stridor, rales, no respiratory distress, I do not note any respiratory abnormality in the patient other than some intermittent tachypnea, but suspect possible hyperventilation Abdominal:     General: Bowel sounds are normal.     Palpations: Abdomen is soft.     Comments: No significant tenderness palpation of the anterior abdomen, she has some mild right flank tenderness although I think it is likely musculoskeletal in nature.  Some pain with straight leg raise on right.  Musculoskeletal:     Comments: Tenderness to palpation of the right lumbar paraspinous muscles with no step-off, deformity, no midline spinal tenderness of cervical, thoracic, or lumbar spinal vertebrae.  Skin:    General: Skin is warm and dry.     Capillary Refill: Capillary refill takes less than 2 seconds.  Neurological:     Mental Status: She is alert and oriented to person, place, and time.  Psychiatric:        Mood and Affect: Mood normal.        Behavior: Behavior normal.     ED Results / Procedures / Treatments   Labs (all labs ordered are listed, but only abnormal results are displayed) Labs Reviewed  CBC - Abnormal; Notable for the following components:      Result Value   Hemoglobin 11.6 (*)    HCT 35.8 (*)    MCV 74.9 (*)    MCH 24.3 (*)    RDW 17.9 (*)    All other components within normal limits  COMPREHENSIVE METABOLIC PANEL - Abnormal; Notable for the following components:   CO2 20 (*)    Glucose, Bld 166 (*)    Calcium 8.7 (*)    Albumin 3.1 (*)    All other components within normal limits  URINALYSIS, ROUTINE W REFLEX MICROSCOPIC - Abnormal; Notable for the following components:   APPearance HAZY (*)    Glucose, UA 50 (*)    Hgb urine dipstick SMALL (*)    Leukocytes,Ua LARGE (*)    Bacteria, UA FEW (*)    All other components within normal limits   RESP PANEL BY RT-PCR (RSV, FLU A&B, COVID)  RVPGX2  BRAIN NATRIURETIC PEPTIDE  D-DIMER, QUANTITATIVE  HCG, QUANTITATIVE, PREGNANCY  PREGNANCY, URINE  TROPONIN I (HIGH SENSITIVITY)  TROPONIN I (HIGH SENSITIVITY)    EKG None  Radiology CT Renal Stone Study Result Date: 05/21/2023 CLINICAL DATA:  48 year old female with history of lower back pain for the past 5 days. Evaluate for kidney stones. EXAM: CT ABDOMEN AND PELVIS WITHOUT CONTRAST TECHNIQUE: Multidetector CT imaging of the abdomen and pelvis was performed following  the standard protocol without IV contrast. RADIATION DOSE REDUCTION: This exam was performed according to the departmental dose-optimization program which includes automated exposure control, adjustment of the mA and/or kV according to patient size and/or use of iterative reconstruction technique. COMPARISON:  CT the abdomen and pelvis 09/02/2019. FINDINGS: Lower chest: Atherosclerotic calcifications are noted in the right coronary artery. Hepatobiliary: Diffuse low attenuation throughout the hepatic parenchyma, indicative of a background of hepatic steatosis. Multiple noncalcified gallstones are noted lying dependently in the gallbladder. Gallbladder is moderately distended. No definite pericholecystic fluid or surrounding inflammatory changes. Pancreas: No pancreatic mass. No pancreatic ductal dilatation. No pancreatic or peripancreatic fluid collections or inflammatory changes. Spleen: Unremarkable. Adrenals/Urinary Tract: There are no abnormal calcifications within the collecting system of either kidney, along the course of either ureter, or within the lumen of the urinary bladder. No hydroureteronephrosis or perinephric stranding to suggest urinary tract obstruction at this time. The unenhanced appearance of the kidneys is unremarkable bilaterally. Urinary bladder is unremarkable in appearance. Bilateral adrenal glands are normal in appearance. Stomach/Bowel: Postoperative  changes in the stomach from prior partial gastrectomy. No pathologic dilatation of small bowel or colon. Normal appendix. Vascular/Lymphatic: No atherosclerotic calcifications are noted in the abdominal aorta. Mild atherosclerosis in the pelvic vasculature. No lymphadenopathy noted in the abdomen or pelvis. Reproductive: Uterus is enlarged and heterogeneous in appearance with multiple lesions, some of which are calcified, presumably multiple fibroids. Ovaries are unremarkable in appearance. Other: Tiny ventral hernia containing only omental fat. No significant volume of ascites. No pneumoperitoneum. Musculoskeletal: There are no aggressive appearing lytic or blastic lesions noted in the visualized portions of the skeleton. IMPRESSION: 1. No acute findings are noted in the abdomen or pelvis to account for the patient's symptoms. Specifically, no nephrolithiasis or findings of urinary tract obstruction. 2. Cholelithiasis without definitive findings to suggest acute cholecystitis at this time. 3. Tiny ventral hernia containing only omental fat. No associated bowel incarceration or obstruction at this time. 4. Hepatic steatosis. 5. Fibroid uterus. 6. Aortic atherosclerosis, in addition to right coronary artery disease. Aortic Atherosclerosis (ICD10-I70.0). Electronically Signed   By: Trudie Reed M.D.   On: 05/21/2023 13:30   DG Chest Portable 1 View Result Date: 05/21/2023 CLINICAL DATA:  48 year old female with history of shortness of breath. EXAM: PORTABLE CHEST 1 VIEW COMPARISON:  No priors. FINDINGS: Lung volumes are low. No consolidative airspace disease. No pleural effusions. No pneumothorax. No pulmonary nodule or mass noted. Pulmonary vasculature and the cardiomediastinal silhouette are within normal limits. IMPRESSION: 1. Low lung volumes without radiographic evidence of acute cardiopulmonary disease. Electronically Signed   By: Trudie Reed M.D.   On: 05/21/2023 08:54    Procedures Procedures     Medications Ordered in ED Medications  ketorolac (TORADOL) 15 MG/ML injection 15 mg (15 mg Intravenous Given 05/21/23 0856)  morphine (PF) 4 MG/ML injection 4 mg (4 mg Intravenous Given 05/21/23 0858)  morphine (PF) 4 MG/ML injection 4 mg (4 mg Intravenous Given 05/21/23 1418)    ED Course/ Medical Decision Making/ A&P                                 Medical Decision Making Amount and/or Complexity of Data Reviewed Labs: ordered. Radiology: ordered.  Risk Prescription drug management.   This patient is a 48 y.o. female  who presents to the ED for concern of shortness of breath, flank pain.   Differential diagnoses prior to  evaluation: The emergent differential diagnosis includes, but is not limited to,  asthma exacerbation, COPD exacerbation, acute upper respiratory infection, acute bronchitis, chronic bronchitis, interstitial lung disease, ARDS, PE, pneumonia, atypical ACS, carbon monoxide poisoning, spontaneous pneumothorax, new CHF vs CHF exacerbation, versus other, AAA, renal artery/vein embolism/thrombosis, mesenteric ischemia, pyelonephritis, nephrolithiasis, cystitis, biliary colic, pancreatitis, perforated peptic ulcer, appendicitis, diverticulitis, bowel obstruction. This is not an exhaustive differential.   Past Medical History / Co-morbidities / Social History: Diabetes, obesity, anemia, fibroids, GERD, asthma  Additional history: Chart reviewed. Pertinent results include: Reviewed lab work, imaging from previous emergency department visits  Physical Exam: Physical exam performed. The pertinent findings include:   Lab Tests/Imaging studies: I personally interpreted labs/imaging and the pertinent results include: UA with large leukocytes, white blood cells, few bacteria, given her flank pain suspicious for probable early UTI despite no dysuria.  Her CBC is overall unremarkable other than mild anemia, hemoglobin 11.6, CMP overall unremarkable, glucose mildly elevated  for nonfasting lab values.  D-dimer negative, BNP unremarkable, normal troponin, COVID, flu, RSV negative.  I independently interpreted CT renal stone study, plain film chest x-ray which shows no evidence of acute intra-abdominal abnormality or intrathoracic abnormality to explain patient's symptoms.. I agree with the radiologist interpretation.  Cardiac monitoring: EKG obtained and interpreted by myself and attending physician which shows: nsr, left ventricular hypertrophy, anterior q waves noted --despite Q waves, no evidence of cardiac or pulmonary pathology today, no evidence of PE, no evidence of ACS.   Medications: I ordered medication including morphine, Toradol for pain.  I have reviewed the patients home medicines and have made adjustments as needed.   Disposition: After consideration of the diagnostic results and the patients response to treatment, I feel that patient with no evidence of kidney stone, no evidence of respiratory abnormality, discussed if she continues to feel shortness of breath despite no explanation she may benefit from official pulmonology testing, we will treat her for urinary tract infection, and musculoskeletal back pain.Marland Kitchen   emergency department workup does not suggest an emergent condition requiring admission or immediate intervention beyond what has been performed at this time. The plan is: as above. The patient is safe for discharge and has been instructed to return immediately for worsening symptoms, change in symptoms or any other concerns.  Final Clinical Impression(s) / ED Diagnoses Final diagnoses:  Acute cystitis with hematuria  Acute right-sided low back pain without sciatica  Shortness of breath    Rx / DC Orders ED Discharge Orders          Ordered    methocarbamol (ROBAXIN) 500 MG tablet  2 times daily        05/21/23 1414    cefadroxil (DURICEF) 500 MG capsule  2 times daily        05/21/23 1414              Sondi Desch, [place] H,  PA-C 05/21/23 1443    Loetta Rough, MD 05/21/23 1521

## 2023-05-21 NOTE — ED Notes (Signed)
X-ray at bedside

## 2023-06-15 DIAGNOSIS — Z760 Encounter for issue of repeat prescription: Secondary | ICD-10-CM | POA: Diagnosis not present

## 2023-06-15 DIAGNOSIS — Z91138 Patient's unintentional underdosing of medication regimen for other reason: Secondary | ICD-10-CM | POA: Diagnosis not present

## 2023-06-15 DIAGNOSIS — Z79899 Other long term (current) drug therapy: Secondary | ICD-10-CM | POA: Diagnosis not present

## 2023-06-15 DIAGNOSIS — Z5320 Procedure and treatment not carried out because of patient's decision for unspecified reasons: Secondary | ICD-10-CM | POA: Diagnosis not present

## 2023-06-15 DIAGNOSIS — T383X6A Underdosing of insulin and oral hypoglycemic [antidiabetic] drugs, initial encounter: Secondary | ICD-10-CM | POA: Diagnosis not present

## 2023-06-15 DIAGNOSIS — E1165 Type 2 diabetes mellitus with hyperglycemia: Secondary | ICD-10-CM | POA: Diagnosis not present

## 2023-06-15 DIAGNOSIS — E119 Type 2 diabetes mellitus without complications: Secondary | ICD-10-CM | POA: Diagnosis not present

## 2023-06-15 DIAGNOSIS — Z7984 Long term (current) use of oral hypoglycemic drugs: Secondary | ICD-10-CM | POA: Diagnosis not present

## 2023-07-20 DIAGNOSIS — D259 Leiomyoma of uterus, unspecified: Secondary | ICD-10-CM | POA: Diagnosis not present

## 2023-07-20 DIAGNOSIS — R102 Pelvic and perineal pain: Secondary | ICD-10-CM | POA: Diagnosis not present

## 2023-07-20 DIAGNOSIS — K760 Fatty (change of) liver, not elsewhere classified: Secondary | ICD-10-CM | POA: Diagnosis not present

## 2023-07-20 DIAGNOSIS — K5900 Constipation, unspecified: Secondary | ICD-10-CM | POA: Diagnosis not present

## 2023-07-20 DIAGNOSIS — E1165 Type 2 diabetes mellitus with hyperglycemia: Secondary | ICD-10-CM | POA: Diagnosis not present

## 2023-07-20 DIAGNOSIS — K8020 Calculus of gallbladder without cholecystitis without obstruction: Secondary | ICD-10-CM | POA: Diagnosis not present

## 2023-07-20 DIAGNOSIS — I10 Essential (primary) hypertension: Secondary | ICD-10-CM | POA: Diagnosis not present

## 2023-07-20 DIAGNOSIS — N83202 Unspecified ovarian cyst, left side: Secondary | ICD-10-CM | POA: Diagnosis not present

## 2023-07-20 DIAGNOSIS — N390 Urinary tract infection, site not specified: Secondary | ICD-10-CM | POA: Diagnosis not present

## 2023-07-20 DIAGNOSIS — Z7984 Long term (current) use of oral hypoglycemic drugs: Secondary | ICD-10-CM | POA: Diagnosis not present

## 2023-08-01 DIAGNOSIS — N200 Calculus of kidney: Secondary | ICD-10-CM | POA: Insufficient documentation

## 2023-08-02 ENCOUNTER — Ambulatory Visit (INDEPENDENT_AMBULATORY_CARE_PROVIDER_SITE_OTHER): Admitting: Family Medicine

## 2023-08-02 ENCOUNTER — Encounter: Payer: Self-pay | Admitting: Family Medicine

## 2023-08-02 VITALS — BP 143/88 | HR 80 | Temp 98.1°F | Resp 18 | Ht 64.0 in | Wt 258.2 lb

## 2023-08-02 DIAGNOSIS — Z6841 Body Mass Index (BMI) 40.0 and over, adult: Secondary | ICD-10-CM

## 2023-08-02 DIAGNOSIS — Z7689 Persons encountering health services in other specified circumstances: Secondary | ICD-10-CM

## 2023-08-02 DIAGNOSIS — G40119 Localization-related (focal) (partial) symptomatic epilepsy and epileptic syndromes with simple partial seizures, intractable, without status epilepticus: Secondary | ICD-10-CM | POA: Diagnosis not present

## 2023-08-02 DIAGNOSIS — I10 Essential (primary) hypertension: Secondary | ICD-10-CM

## 2023-08-02 DIAGNOSIS — E1165 Type 2 diabetes mellitus with hyperglycemia: Secondary | ICD-10-CM | POA: Diagnosis not present

## 2023-08-02 MED ORDER — OLMESARTAN MEDOXOMIL-HCTZ 20-12.5 MG PO TABS: 1.0000 | ORAL_TABLET | Freq: Every day | ORAL | 3 refills | Status: AC

## 2023-08-02 MED ORDER — GABAPENTIN 100 MG PO CAPS: 100.0000 mg | ORAL_CAPSULE | Freq: Every day | ORAL | 4 refills | Status: AC

## 2023-08-02 MED ORDER — GLIMEPIRIDE 4 MG PO TABS: 4.0000 mg | ORAL_TABLET | Freq: Every morning | ORAL | 3 refills | Status: AC

## 2023-08-02 MED ORDER — ZONISAMIDE 100 MG PO CAPS: 300.0000 mg | ORAL_CAPSULE | Freq: Every day | ORAL | 3 refills | Status: AC

## 2023-08-02 NOTE — Progress Notes (Signed)
 New Patient Office Visit  Subjective    Patient ID: Cassandra Kim, female    DOB: 06-19-1975  Age: 48 y.o. MRN: [id number]  CC:  Chief Complaint  Patient presents with   Establish Care    Patient is here To establish care with a new PCP,    HPI Cassandra Kim presents to establish care. Pt is new to me.   Diabetes uncontrolled She starts off by saying she doesn't feel good. She was recently seen in ER 2 weeks ago as she was in between trying to find a new provider and was out of her diabetic medicines for at least 2 weeks.  She works at [employer] from [employer]. She reports her sleep schedule is off along with her sugars. She was taking Amaryl 4mg  daily along with Metformin 500mg  2 tabs po BID. She was on Ozempic but provider stopped due to constipation. She is no longer taking [place] and seems confused on when this was started. She reports she doesn't check sugars at home. She also has hx of neuropathy and taking Gabapentin. Pt request refills for her Amaryl, Gabapentin today. Pt needs referral to Endocrinologist.  She is trying to lose weight and has been unsuccessful. She requests referral to Bariatric surgery. She has hx of lap gastric sleeve in the past that helped with her weight loss.   Hypertension Pt taking Olmesartan/hydrochlorothiazide 20/12.5 mg daily for HTN. She needs this refilled today.  Epilepsy Pt has hx of epilepsy. She use to have Neurologist through different hospital system. She request referral to Neurology. She is taking Zonegran 100mg  3 capsules at bedtime. She needs this refilled today.   Outpatient Encounter Medications as of 08/02/2023  Medication Sig   Blood Glucose Monitoring Suppl w/Device KIT 1 kit by Does not apply route daily.   Cholecalciferol 50 MCG (2000 UT) CAPS Take by mouth.   clotrimazole-betamethasone (LOTRISONE) cream Apply topically.   cyclobenzaprine (FLEXERIL) 10 MG tablet Take 1 tablet (10 mg total) by mouth 3 (three) times daily as  needed for muscle spasms.   FERREX 150 150 MG capsule TAKE 1 CAPSULE BY MOUTH EVERY DAY   Ferrous Sulfate (IRON PO) Take 25 mg by mouth daily.   fluticasone (FLONASE) 50 MCG/ACT nasal spray Place 1 spray into both nostrils daily.   gabapentin (NEURONTIN) 100 MG capsule TAKE 1 CAPSULE BY MOUTH AT BEDTIME.   glimepiride (AMARYL) 4 MG tablet Take 4 mg by mouth every morning.   glucose blood test strip Use as instructed   hydrOXYzine (ATARAX/VISTARIL) 25 MG tablet Take 1 tablet (25 mg total) by mouth 3 (three) times daily as needed for itching.   Lancet Devices (LANCING DEVICE) MISC 1 Units by Does not apply route daily.   Lancets (ONETOUCH ULTRASOFT) lancets Use as instructed   Menthol-Methyl Salicylate (TIGER BALM LINIMENT EX) Apply 1 application topically daily as needed (aches and pain).   metFORMIN (GLUCOPHAGE-XR) 500 MG 24 hr tablet TAKE 2 TABLETS BY MOUTH IN THE MORNING AND AT BEDTIME.   montelukast (SINGULAIR) 10 MG tablet Take 10 mg by mouth at bedtime.   olmesartan-hydrochlorothiazide (BENICAR HCT) 20-12.5 MG tablet Take 1 tablet by mouth daily.   Semaglutide, 2 MG/DOSE, (OZEMPIC, 2 MG/DOSE,) 8 MG/3ML SOPN Inject into the skin.   spironolactone (ALDACTONE) 25 MG tablet Take 25 mg by mouth daily.   zonisamide (ZONEGRAN) 100 MG capsule Take 3 capsules by mouth at bedtime.   cefadroxil (DURICEF) 500 MG capsule Take 1 capsule (500 mg total)  by mouth 2 (two) times daily.   dapagliflozin propanediol (FARXIGA) 10 MG TABS tablet Take 1 tablet (10 mg total) by mouth daily. (Patient not taking: Reported on 08/02/2023)   Elagolix-Estrad-Noreth & Elago (ORIAHNN) 300-1-0.5 & 300 MG CPPK Take 1 capsule by mouth in the morning and at bedtime.   methocarbamol (ROBAXIN) 500 MG tablet Take 1 tablet (500 mg total) by mouth 2 (two) times daily. (Patient not taking: Reported on 08/02/2023)   rosuvastatin (CRESTOR) 20 MG tablet Take 1 tablet (20 mg total) by mouth daily.   No facility-administered encounter  medications on file as of 08/02/2023.    Past Medical History:  Diagnosis Date   Abnormal uterine bleeding    Anemia    Asthma    Diabetes mellitus without complication (HCC)    type 2    Fibroid    GERD (gastroesophageal reflux disease)    Obesity    Seizures (HCC) 161096    Past Surgical History:  Procedure Laterality Date   BREAST REDUCTION SURGERY Bilateral    HYSTEROSCOPY WITH SALPINGOGRAM     KIDNEY STONE SURGERY     LAPAROSCOPIC GASTRIC SLEEVE RESECTION     PANNICULECTOMY      Family History  Problem Relation Age of Onset   Asthma Mother    Diabetes Mother    Cancer Sister     Social History   Socioeconomic History   Marital status: Single    Spouse name: Not on file   Number of children: 0   Years of education: Not on file   Highest education level: Associate degree: occupational, Scientist, product/process development, or vocational program  Occupational History   Not on file  Tobacco Use   Smoking status: Never   Smokeless tobacco: Never  Vaping Use   Vaping status: Never Used  Substance and Sexual Activity   Alcohol use: Never   Drug use: Never   Sexual activity: Not Currently    Partners: Female    Birth control/protection: Abstinence    Comment: last SA x9 years ago per patient  Other Topics Concern   Not on file  Social History Narrative   Not on file   Social Drivers of Health   Financial Resource Strain: Patient Declined (07/31/2023)   Overall Financial Resource Strain (CARDIA)    Difficulty of Paying Living Expenses: Patient declined  Food Insecurity: Patient Declined (07/31/2023)   Hunger Vital Sign    Worried About Running Out of Food in the Last Year: Patient declined    Ran Out of Food in the Last Year: Patient declined  Transportation Needs: No Transportation Needs (01/20/2023)   PRAPARE - Administrator, Civil Service (Medical): No    Lack of Transportation (Non-Medical): No  Physical Activity: Unknown (07/31/2023)   Exercise Vital Sign    Days  of Exercise per Week: 1 day    Minutes of Exercise per Session: Patient declined  Stress: Patient Declined (07/31/2023)   [employer] of Occupational Health - Occupational Stress Questionnaire    Feeling of Stress : Patient declined  Social Connections: Socially Isolated (07/31/2023)   Social Connection and Isolation Panel [NHANES]    Frequency of Communication with Friends and Family: Never    Frequency of Social Gatherings with Friends and Family: Never    Attends Religious Services: 1 to 4 times per year    Active Member of [employer] or Organizations: No    Attends Engineer, structural: Not on file    Marital Status: Divorced  Intimate Partner Violence: Not At Risk (07/20/2023)   Received from [facility]   HITS    Over the last 12 months how often did your partner physically hurt you?: Never    Over the last 12 months how often did your partner insult you or talk down to you?: Never    Over the last 12 months how often did your partner threaten you with physical harm?: Never    Over the last 12 months how often did your partner scream or curse at you?: Never    Review of Systems  Constitutional:  Positive for malaise/fatigue.  All other systems reviewed and are negative.      Objective    There were no vitals taken for this visit.  Physical Exam Vitals and nursing note reviewed.  Constitutional:      Appearance: Normal appearance. She is obese.  HENT:     Head: Normocephalic and atraumatic.     Right Ear: External ear normal.     Left Ear: External ear normal.     Nose: Nose normal.     Mouth/Throat:     Mouth: Mucous membranes are moist.     Pharynx: Oropharynx is clear.  Eyes:     Conjunctiva/sclera: Conjunctivae normal.     Pupils: Pupils are equal, round, and reactive to light.  Cardiovascular:     Rate and Rhythm: Normal rate.  Pulmonary:     Effort: Pulmonary effort is normal.  Skin:    General: Skin is warm.     Capillary Refill: Capillary  refill takes less than 2 seconds.  Neurological:     General: No focal deficit present.     Mental Status: She is alert and oriented to person, place, and time. Mental status is at baseline.  Psychiatric:        Mood and Affect: Mood normal.        Behavior: Behavior normal.        Thought Content: Thought content normal.        Judgment: Judgment normal.       Assessment & Plan:   Problem List Items Addressed This Visit   None Encounter to establish care with new doctor  Type 2 diabetes mellitus with hyperglycemia, without long-term current use of insulin (HCC) -     Ambulatory referral to Endocrinology -     Hemoglobin A1c -     Comprehensive metabolic panel with GFR -     Microalbumin / creatinine urine ratio -     Glimepiride; Take 1 tablet (4 mg total) by mouth every morning.  Dispense: 30 tablet; Refill: 3 -     Gabapentin; Take 1 capsule (100 mg total) by mouth at bedtime.  Dispense: 30 capsule; Refill: 4  Primary hypertension -     Olmesartan Medoxomil-HCTZ; Take 1 tablet by mouth daily.  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  Dispense: 90 capsule; Refill: 3  BMI 40.0-44.9, adult (HCC) -     Amb Referral to Bariatric Surgery   Pt here to establish care. Needs chronic medication refills today as listed above. Referrals placed for Endocrinology for diabetes management, neurology for seizure management, and bariatric surgery for weight loss options Screening A1c, CMP, and urine micro today. Follow up on results via mychart. Follow up in 3 months  sooner pending results.  No follow-ups on file.   Suzan Slick, MD

## 2023-08-03 ENCOUNTER — Emergency Department: Admission: EM | Admit: 2023-08-03 | Discharge: 2023-08-03 | Disposition: A | Discharge status: Home / Self Care

## 2023-08-03 DIAGNOSIS — N12 Tubulo-interstitial nephritis, not specified as acute or chronic: Secondary | ICD-10-CM | POA: Insufficient documentation

## 2023-08-03 DIAGNOSIS — I10 Essential (primary) hypertension: Secondary | ICD-10-CM | POA: Diagnosis not present

## 2023-08-03 DIAGNOSIS — M25511 Pain in right shoulder: Secondary | ICD-10-CM | POA: Diagnosis not present

## 2023-08-03 DIAGNOSIS — E119 Type 2 diabetes mellitus without complications: Secondary | ICD-10-CM | POA: Insufficient documentation

## 2023-08-03 LAB — COMPREHENSIVE METABOLIC PANEL WITH GFR
ALT: 26 IU/L (ref 0–32)
AST: 16 IU/L (ref 0–40)
Albumin: 3.8 g/dL — ABNORMAL LOW (ref 3.9–4.9)
Alkaline Phosphatase: 122 IU/L — ABNORMAL HIGH (ref 44–121)
BUN/Creatinine Ratio: 15 (ref 9–23)
BUN: 12 mg/dL (ref 6–24)
Bilirubin Total: <0.2 mg/dL (ref 0.0–1.2)
CO2: 19 mmol/L — ABNORMAL LOW (ref 20–29)
Calcium: 9.1 mg/dL (ref 8.7–10.2)
Chloride: 103 mmol/L (ref 96–106)
Creatinine, Ser: 0.8 mg/dL (ref 0.57–1.00)
Globulin, Total: 3.2 g/dL (ref 1.5–4.5)
Glucose: 384 mg/dL — ABNORMAL HIGH (ref 70–99)
Potassium: 4.7 mmol/L (ref 3.5–5.2)
Sodium: 137 mmol/L (ref 134–144)
Total Protein: 7 g/dL (ref 6.0–8.5)
eGFR: 91 mL/min/{1.73_m2} (ref >59)

## 2023-08-03 LAB — MICROALBUMIN / CREATININE URINE RATIO
Creatinine, Urine: 46.1 mg/dL
Microalb/Creat Ratio: 33 mg/g{creat} — ABNORMAL HIGH (ref 0–29)
Microalbumin, Urine: 15.3 ug/mL

## 2023-08-03 LAB — HEMOGLOBIN A1C
Est. average glucose Bld gHb Est-mCnc: 335 mg/dL
Hgb A1c MFr Bld: 13.3 % — ABNORMAL HIGH (ref 4.8–5.6)

## 2023-08-03 MED ORDER — LIDOCAINE 5 % EX PTCH
1.0000 | MEDICATED_PATCH | CUTANEOUS | Status: DC
  Administered 2023-08-03: 1 via TRANSDERMAL
  Filled 2023-08-03: qty 1

## 2023-08-03 MED ORDER — SULFAMETHOXAZOLE-TRIMETHOPRIM 800-160 MG PO TABS: 1.0000 | ORAL_TABLET | Freq: Two times a day (BID) | ORAL | 0 refills | Status: AC

## 2023-08-03 MED ORDER — ACETAMINOPHEN 500 MG PO TABS
1000.0000 mg | ORAL_TABLET | Freq: Once | ORAL | Status: AC
  Administered 2023-08-03: 1000 mg via ORAL
  Filled 2023-08-03: qty 2

## 2023-08-03 MED ORDER — ONDANSETRON 4 MG PO TBDP
4.0000 mg | ORAL_TABLET | Freq: Once | ORAL | Status: AC
  Administered 2023-08-03: 4 mg via ORAL
  Filled 2023-08-03: qty 1

## 2023-08-03 MED ORDER — LIDOCAINE 5 % EX PTCH: 1.0000 | MEDICATED_PATCH | Freq: Two times a day (BID) | CUTANEOUS | 0 refills | Status: AC

## 2023-08-03 MED ORDER — ONDANSETRON 4 MG PO TBDP: 4.0000 mg | ORAL_TABLET | Freq: Three times a day (TID) | ORAL | 0 refills | Status: AC | PRN

## 2023-08-04 ENCOUNTER — Other Ambulatory Visit: Payer: Self-pay | Admitting: Family Medicine

## 2023-08-04 ENCOUNTER — Encounter: Payer: Self-pay | Admitting: Family Medicine

## 2023-08-04 DIAGNOSIS — E1165 Type 2 diabetes mellitus with hyperglycemia: Secondary | ICD-10-CM

## 2023-08-04 DIAGNOSIS — Z6841 Body Mass Index (BMI) 40.0 and over, adult: Secondary | ICD-10-CM

## 2023-08-04 MED ORDER — SEMAGLUTIDE(0.25 OR 0.5MG/DOS) 2 MG/3ML ~~LOC~~ SOPN
0.2500 mg | PEN_INJECTOR | SUBCUTANEOUS | 2 refills | Status: DC
Start: 2023-08-04 — End: 2023-08-07

## 2023-08-07 ENCOUNTER — Other Ambulatory Visit: Payer: Self-pay | Admitting: Family Medicine

## 2023-08-07 ENCOUNTER — Encounter: Payer: Self-pay | Admitting: Family Medicine

## 2023-08-07 DIAGNOSIS — Z6841 Body Mass Index (BMI) 40.0 and over, adult: Secondary | ICD-10-CM

## 2023-08-07 DIAGNOSIS — E1165 Type 2 diabetes mellitus with hyperglycemia: Secondary | ICD-10-CM

## 2023-08-07 MED ORDER — TIRZEPATIDE 2.5 MG/0.5ML ~~LOC~~ SOAJ: 2.5000 mg | SUBCUTANEOUS | 1 refills | Status: AC

## 2023-08-28 DIAGNOSIS — R448 Other symptoms and signs involving general sensations and perceptions: Secondary | ICD-10-CM | POA: Diagnosis not present

## 2023-10-09 ENCOUNTER — Other Ambulatory Visit: Payer: Self-pay | Admitting: Family Medicine

## 2023-10-09 DIAGNOSIS — E119 Type 2 diabetes mellitus without complications: Secondary | ICD-10-CM

## 2023-10-09 MED ORDER — METFORMIN HCL ER 500 MG PO TB24: 500.0000 mg | ORAL_TABLET | Freq: Every day | ORAL | 1 refills | Status: AC

## 2023-10-09 NOTE — Telephone Encounter (Unsigned)
 Copied from CRM [id number]. Topic: Clinical - Medication Refill >> Oct 09, 2023  9:49 AM Rosamond Comes wrote: Patient requesting refill, patient is in [place] care of her parents  Medication: metFORMIN  (GLUCOPHAGE -XR) 500 MG 24 hr tablet  Has the patient contacted their pharmacy? Yes pharmacy stated they needed new prescription   This is the patient's preferred pharmacy:   [facility] [address] [place], [place] - [address] [place] [address] Phone: [phone] Fax: [phone]  Is this the correct pharmacy for this prescription? Yes If no, delete pharmacy and type the correct one.   Has the prescription been filled recently? No  Is the patient out of the medication? Yes  Has the patient been seen for an appointment in the last year OR does the patient have an upcoming appointment? Yes  Can we respond through MyChart? Yes  Agent: Please be advised that Rx refills may take up to 3 business days. We ask that you follow-up with your pharmacy.

## 2023-11-01 ENCOUNTER — Ambulatory Visit: Admitting: Family Medicine

## 2023-11-06 ENCOUNTER — Telehealth: Payer: Self-pay

## 2023-11-06 NOTE — Telephone Encounter (Signed)
 Unable to complete PA process for Mounjaro . Per CMM, the insurance is not active/member is not found for [employer] or [facility] Medicaid. Contacted the patient, no answer. Left a detailed vm msg regarding the reason of my call. Patient informed to return a call back or upload a copy of their current insurance information. Direct call back info provided.

## 2023-12-02 ENCOUNTER — Other Ambulatory Visit: Payer: Self-pay | Admitting: Family Medicine

## 2023-12-02 DIAGNOSIS — I10 Essential (primary) hypertension: Secondary | ICD-10-CM

## 2023-12-04 NOTE — Telephone Encounter (Signed)
 Requesting rx rf of olmesartan  /hydrochlorothiazide Last written 04/02/025 Last OV 04/02/025 Upcoming appt none

## 2024-05-06 ENCOUNTER — Ambulatory Visit: Admitting: Endocrinology

## 2024-05-25 ENCOUNTER — Other Ambulatory Visit: Payer: Self-pay | Admitting: Family Medicine

## 2024-05-25 DIAGNOSIS — G40119 Localization-related (focal) (partial) symptomatic epilepsy and epileptic syndromes with simple partial seizures, intractable, without status epilepticus: Secondary | ICD-10-CM
